# Patient Record
Sex: Female | Born: 1960 | Race: White | Hispanic: No | Marital: Single | Smoking: Former smoker
Health system: Southern US, Community
[De-identification: ages and names within clinical notes are randomized; demographics above are authoritative.]

## PROBLEM LIST (undated history)

## (undated) DIAGNOSIS — E039 Hypothyroidism, unspecified: Secondary | ICD-10-CM

## (undated) DIAGNOSIS — G43909 Migraine, unspecified, not intractable, without status migrainosus: Secondary | ICD-10-CM

## (undated) HISTORY — DX: Hypothyroidism, unspecified: E03.9

## (undated) HISTORY — PX: OTHER SURGICAL HISTORY: SHX169

## (undated) HISTORY — DX: Migraine, unspecified, not intractable, without status migrainosus: G43.909

---

## 2014-04-27 DIAGNOSIS — S63642A Sprain of metacarpophalangeal joint of left thumb, initial encounter: Secondary | ICD-10-CM | POA: Insufficient documentation

## 2014-07-17 DIAGNOSIS — S63502A Unspecified sprain of left wrist, initial encounter: Secondary | ICD-10-CM | POA: Insufficient documentation

## 2015-12-27 DIAGNOSIS — E039 Hypothyroidism, unspecified: Secondary | ICD-10-CM | POA: Insufficient documentation

## 2016-01-03 ENCOUNTER — Other Ambulatory Visit: Payer: Self-pay | Admitting: Obstetrics and Gynecology

## 2016-01-03 DIAGNOSIS — Z1231 Encounter for screening mammogram for malignant neoplasm of breast: Secondary | ICD-10-CM

## 2016-01-07 ENCOUNTER — Ambulatory Visit
Admission: RE | Admit: 2016-01-07 | Discharge: 2016-01-07 | Disposition: A | Discharge status: Home / Self Care | Payer: Managed Care, Other (non HMO) | Source: Ambulatory Visit | Attending: Obstetrics and Gynecology | Admitting: Obstetrics and Gynecology

## 2016-01-07 DIAGNOSIS — Z1231 Encounter for screening mammogram for malignant neoplasm of breast: Secondary | ICD-10-CM

## 2016-12-27 ENCOUNTER — Other Ambulatory Visit: Payer: Self-pay | Admitting: Obstetrics and Gynecology

## 2016-12-27 ENCOUNTER — Other Ambulatory Visit: Payer: Self-pay | Admitting: Nurse Practitioner

## 2016-12-27 DIAGNOSIS — Z1231 Encounter for screening mammogram for malignant neoplasm of breast: Secondary | ICD-10-CM

## 2017-01-08 ENCOUNTER — Ambulatory Visit
Admission: RE | Admit: 2017-01-08 | Discharge: 2017-01-08 | Disposition: A | Discharge status: Home / Self Care | Payer: 59 | Source: Ambulatory Visit | Attending: Nurse Practitioner | Admitting: Nurse Practitioner

## 2017-01-08 DIAGNOSIS — Z1231 Encounter for screening mammogram for malignant neoplasm of breast: Secondary | ICD-10-CM

## 2017-07-31 ENCOUNTER — Telehealth: Payer: Self-pay | Admitting: *Deleted

## 2017-07-31 MED ORDER — MEDROXYPROGESTERONE ACETATE 2.5 MG PO TABS: 2.5000 mg | ORAL_TABLET | Freq: Every day | ORAL | 0 refills | Status: DC

## 2017-07-31 NOTE — Telephone Encounter (Signed)
Pt called requesting refill on provera 2.5 mg tablet 1 po daily, pt annual scheduled on 08/14/17

## 2017-08-14 ENCOUNTER — Encounter: Payer: Self-pay | Admitting: Obstetrics & Gynecology

## 2017-09-22 ENCOUNTER — Other Ambulatory Visit: Payer: Self-pay | Admitting: Obstetrics & Gynecology

## 2017-09-24 NOTE — Telephone Encounter (Signed)
Former Chief Technology Officer OB-Gyn patient. Last Annual exam was 02/11/2016.  Was originally scheduled here with you on 08/14/17 but cancelled that appt. Rescheduled now for 11/22/17.  Requesting refills for Provera 2.5 mg.

## 2017-10-01 ENCOUNTER — Telehealth: Payer: Self-pay | Admitting: *Deleted

## 2017-10-01 MED ORDER — ESTRADIOL 0.05 MG/24HR TD PTWK: 0.0500 mg | MEDICATED_PATCH | TRANSDERMAL | 0 refills | Status: DC

## 2017-10-01 NOTE — Telephone Encounter (Signed)
Paper chart from [facility] has arrived needs refill on estradiol 0.5 mg patch weekly sent, annual scheduled 11/22/17. Rx sent.

## 2017-11-22 ENCOUNTER — Ambulatory Visit: Payer: BLUE CROSS/BLUE SHIELD | Admitting: Obstetrics & Gynecology

## 2017-11-22 ENCOUNTER — Encounter: Payer: Self-pay | Admitting: Obstetrics & Gynecology

## 2017-11-22 VITALS — BP 124/78 | Ht 64.5 in | Wt 174.4 lb

## 2017-11-22 DIAGNOSIS — R6882 Decreased libido: Secondary | ICD-10-CM

## 2017-11-22 DIAGNOSIS — Z01419 Encounter for gynecological examination (general) (routine) without abnormal findings: Secondary | ICD-10-CM

## 2017-11-22 DIAGNOSIS — Z13820 Encounter for screening for osteoporosis: Secondary | ICD-10-CM

## 2017-11-22 DIAGNOSIS — N951 Menopausal and female climacteric states: Secondary | ICD-10-CM

## 2017-11-22 MED ORDER — ESTRADIOL 0.075 MG/24HR TD PTWK: 0.0500 mg | MEDICATED_PATCH | TRANSDERMAL | 4 refills | Status: DC

## 2017-11-22 MED ORDER — MEDROXYPROGESTERONE ACETATE 2.5 MG PO TABS: 2.5000 mg | ORAL_TABLET | Freq: Every day | ORAL | 4 refills | Status: DC

## 2017-11-22 NOTE — Patient Instructions (Addendum)
1. Encounter for routine gynecological examination with Papanicolaou smear of cervix Normal gynecologic exam.  Pap reflex done.  Breast exam normal.  Last screening mammogram in August 2018 was negative.  Colonoscopy in 2014.  Health labs with family physician.  Body mass index 29.47.  Recommend low calorie/low carb nutrition such as [employer] diet with regular physical activity, aerobic activities 5 times a week and weightlifting every 2 days.  2. Menopause syndrome Symptomatic menopause with vasomotor symptoms which were controlled with estradiol 0.075 mg patch weekly and Provera 2.5 mg 1 tablet daily.  Will prescribe hormone replacement therapy at that dosage.  Risks and benefits of hormone replacement therapy known to patient.  Wishes to continue at this time.  No postmenopausal bleeding.  3. Low libido Multifactorial aspect of low libido reviewed with patient.  After working on relationship and psychologic aspects of it, if still experiencing low libido, will follow up for counseling and management with me.  4. Screening for osteoporosis Vitamin D supplement, calcium rich nutrition and regular weightbearing physical activity recommended.  She will schedule a repeat bone density at the breast center. - DG Bone Density; Future  Other orders - thyroid (ARMOUR) 90 MG tablet; Take 90 mg by mouth daily. - cholecalciferol (VITAMIN D) 1000 units tablet; Take 1,000 Units by mouth daily. - medroxyPROGESTERone (PROVERA) 2.5 MG tablet; Take 1 tablet (2.5 mg total) by mouth daily. - estradiol (CLIMARA - DOSED IN MG/24 HR) 0.075 mg/24hr patch; Place 1 patch (0.075 mg total) onto the skin once a week.  Cala BradfordKimberly, it was a pleasure seeing you today!  I will inform you of your results as soon as they are available.

## 2017-11-22 NOTE — Progress Notes (Signed)
Tina Pace 01/16/1961 [id number]   History:    57 y.o. G0 Same sex partner x 3 years, separated currently  RP:  Established patient presenting for annual gyn exam   HPI: Menopause syndrome well on HRT.  Having hot flushes/night sweats on Estradiol patch 0.05 weekly, would like to be back on 0.075.  Provera 2.5 mg daily.  No PMB.  No pelvic pain.  Low libido.  Urine/BMs wnl.  Breasts wnl.  BMI 29.47.  Fasting health labs with Fam MD.    Past medical history,surgical history, family history and social history were all reviewed and documented in the EPIC chart.  Gynecologic History No LMP recorded. Patient is postmenopausal. Contraception: post menopausal status/Same sex partner Last Pap: 01/2016. Results were: Negative/HPV HR neg Last mammogram: 12/2016. Results were: Negative Bone Density: 2010.  Will schedule at the Breast center Colonoscopy: 2014  Obstetric History OB History  Gravida Para Term Preterm AB Living  0 0 0 0 0 0  SAB TAB Ectopic Multiple Live Births  0 0 0 0 0     ROS: A ROS was performed and pertinent positives and negatives are included in the history.  GENERAL: No fevers or chills. HEENT: No change in vision, no earache, sore throat or sinus congestion. NECK: No pain or stiffness. CARDIOVASCULAR: No chest pain or pressure. No palpitations. PULMONARY: No shortness of breath, cough or wheeze. GASTROINTESTINAL: No abdominal pain, nausea, vomiting or diarrhea, melena or bright red blood per rectum. GENITOURINARY: No urinary frequency, urgency, hesitancy or dysuria. MUSCULOSKELETAL: No joint or muscle pain, no back pain, no recent trauma. DERMATOLOGIC: No rash, no itching, no lesions. ENDOCRINE: No polyuria, polydipsia, no heat or cold intolerance. No recent change in weight. HEMATOLOGICAL: No anemia or easy bruising or bleeding. NEUROLOGIC: No headache, seizures, numbness, tingling or weakness. PSYCHIATRIC: No depression, no loss of interest in normal activity or change  in sleep pattern.     Exam:   BP 124/78   Ht 5' 4.5" (1.638 m)   Wt 174 lb 6.4 oz (79.1 kg)   BMI 29.47 kg/m   Body mass index is 29.47 kg/m.  General appearance : Well developed well nourished female. No acute distress HEENT: Eyes: no retinal hemorrhage or exudates,  Neck supple, trachea midline, no carotid bruits, no thyroidmegaly Lungs: Clear to auscultation, no rhonchi or wheezes, or rib retractions  Heart: Regular rate and rhythm, no murmurs or gallops Breast:Examined in sitting and supine position were symmetrical in appearance, no palpable masses or tenderness,  no skin retraction, no nipple inversion, no nipple discharge, no skin discoloration, no axillary or supraclavicular lymphadenopathy Abdomen: no palpable masses or tenderness, no rebound or guarding Extremities: no edema or skin discoloration or tenderness  Pelvic: Vulva: Normal             Vagina: No gross lesions or discharge  Cervix: No gross lesions or discharge.  Pap reflex done  Uterus  AV, normal size, shape and consistency, non-tender and mobile  Adnexa  Without masses or tenderness  Anus: Normal   Assessment/Plan:  57 y.o. female for annual exam   1. Encounter for routine gynecological examination with Papanicolaou smear of cervix Normal gynecologic exam.  Pap reflex done.  Breast exam normal.  Last screening mammogram in August 2018 was negative.  Colonoscopy in 2014.  Health labs with family physician.  Body mass index 29.47.  Recommend low calorie/low carb nutrition such as [employer] diet with regular physical activity, aerobic activities 5 times  a week and weightlifting every 2 days.  2. Menopause syndrome Symptomatic menopause with vasomotor symptoms which were controlled with estradiol 0.075 mg patch weekly and Provera 2.5 mg 1 tablet daily.  Will prescribe hormone replacement therapy at that dosage.  Risks and benefits of hormone replacement therapy known to patient.  Wishes to continue at this  time.  No postmenopausal bleeding.  3. Low libido Multifactorial aspect of low libido reviewed with patient.  After working on relationship and psychologic aspects of it, if still experiencing low libido, will follow up for counseling and management with me.  4. Screening for osteoporosis Vitamin D supplement, calcium rich nutrition and regular weightbearing physical activity recommended.  She will schedule a repeat bone density at the breast center. - DG Bone Density; Future  Other orders - thyroid (ARMOUR) 90 MG tablet; Take 90 mg by mouth daily. - cholecalciferol (VITAMIN D) 1000 units tablet; Take 1,000 Units by mouth daily. - medroxyPROGESTERone (PROVERA) 2.5 MG tablet; Take 1 tablet (2.5 mg total) by mouth daily. - estradiol (CLIMARA - DOSED IN MG/24 HR) 0.075 mg/24hr patch; Place 1 patch (0.075 mg total) onto the skin once a week.  Counseling on above issues and coordination of care more than 50% for 10 minutes.  [name] MD, 2:49 PM 11/22/2017

## 2017-11-26 LAB — PAP IG W/ RFLX HPV ASCU

## 2017-11-26 LAB — HUMAN PAPILLOMAVIRUS, HIGH RISK: HPV DNA High Risk: NOT DETECTED

## 2017-12-19 ENCOUNTER — Other Ambulatory Visit: Payer: Self-pay | Admitting: Obstetrics & Gynecology

## 2017-12-19 DIAGNOSIS — E2839 Other primary ovarian failure: Secondary | ICD-10-CM

## 2017-12-24 ENCOUNTER — Other Ambulatory Visit: Payer: Self-pay | Admitting: Obstetrics & Gynecology

## 2018-04-09 ENCOUNTER — Other Ambulatory Visit: Payer: Self-pay | Admitting: Obstetrics & Gynecology

## 2018-04-09 ENCOUNTER — Telehealth: Payer: Self-pay

## 2018-04-09 DIAGNOSIS — N950 Postmenopausal bleeding: Secondary | ICD-10-CM

## 2018-04-09 NOTE — Telephone Encounter (Signed)
Stop HRT and schedule a Pelvic US with me.

## 2018-04-09 NOTE — Telephone Encounter (Signed)
Patient complained of bleeding that started on the weekend while out of town. She said she forgot her Provera 2.5 and missed a few doses. Also, had the lower dose patch with her instead of the 0.075 she is on.  She said that now that she is back on her provera that each time she takes it the flow lightens but like a regular flow during the day.  What to rec?

## 2018-04-09 NOTE — Telephone Encounter (Signed)
No change, same recommendations.

## 2018-04-09 NOTE — Telephone Encounter (Signed)
Called and [facility] detailed message [facility] mail [facility]. Advised patient Dr. Simon RheinML's recommendation. I told her order has been placed and she just needs to call back and speak with appointment desk to schedule an ultrasound appt with Dr. Mackey BirchwoodML.

## 2018-04-09 NOTE — Telephone Encounter (Signed)
Patient called back and said she realized she had actually missed two weeks of her Provera. She questions if this could be why she is bleeding and does it change your recommendation?

## 2018-04-09 NOTE — Telephone Encounter (Signed)
Patient advised. Tina Pace will call her back to schedule.

## 2018-04-10 NOTE — Telephone Encounter (Signed)
Spoke with patient and informed her. °

## 2018-04-10 NOTE — Telephone Encounter (Signed)
Patient called back today. First available u/s is Dec 18th and she is scheduled. She is asking if it might be okay if she stayed on the HRT a bit longer. She said she cannot think clearly without it and has bad hotflashes at night. Anniversary of her Mom's death is day after Thanksgiving and she just feels like she needs her HRT.  She said even if just a few weeks and stop it a little closer to u/s.

## 2018-04-10 NOTE — Telephone Encounter (Signed)
Please stop at least 2 weeks prior to US.

## 2018-05-01 ENCOUNTER — Other Ambulatory Visit: Payer: Self-pay | Admitting: *Deleted

## 2018-05-01 ENCOUNTER — Ambulatory Visit (INDEPENDENT_AMBULATORY_CARE_PROVIDER_SITE_OTHER): Payer: BLUE CROSS/BLUE SHIELD

## 2018-05-01 ENCOUNTER — Other Ambulatory Visit: Payer: Self-pay | Admitting: Obstetrics & Gynecology

## 2018-05-01 ENCOUNTER — Ambulatory Visit: Payer: BLUE CROSS/BLUE SHIELD | Admitting: Obstetrics & Gynecology

## 2018-05-01 DIAGNOSIS — N838 Other noninflammatory disorders of ovary, fallopian tube and broad ligament: Secondary | ICD-10-CM

## 2018-05-01 DIAGNOSIS — N950 Postmenopausal bleeding: Secondary | ICD-10-CM | POA: Diagnosis not present

## 2018-05-01 DIAGNOSIS — N951 Menopausal and female climacteric states: Secondary | ICD-10-CM | POA: Diagnosis not present

## 2018-05-01 DIAGNOSIS — Z79890 Hormone replacement therapy (postmenopausal): Secondary | ICD-10-CM

## 2018-05-01 MED ORDER — PROGESTERONE MICRONIZED 100 MG PO CAPS: 100.0000 mg | ORAL_CAPSULE | Freq: Every day | ORAL | 4 refills | Status: DC

## 2018-05-01 NOTE — Progress Notes (Signed)
    Tina BowlKimberly Pace 06/22/1960 [id number]        57 y.o.  G0 Single  RP: PMB on HRT  HPI: Used a lower Estradiol patch by mistake and started spotting 2 weeks ago.  Stopped HRT at that time and has had no vaginal bleeding since then.  No pelvic pain.   OB History  Gravida Para Term Preterm AB Living  0 0 0 0 0 0  SAB TAB Ectopic Multiple Live Births  0 0 0 0 0    Past medical history,surgical history, problem list, medications, allergies, family history and social history were all reviewed and documented in the EPIC chart.   Directed ROS with pertinent positives and negatives documented in the history of present illness/assessment and plan.  Exam:  There were no vitals filed for this visit. General appearance:  Normal  Pelvic US today: T/V images.  Anteverted uterus measuring 7.31 x 4.0 x 3.69 cm.  Intramural fibroid 1.2 x 1 cm, subserous fibroid 2.5 x 1.8 cm.  Endometrial lining normal and thin at 2.1 mm.  Right ovarian solid mass with calcifications and posterior wall shadowing measuring 1.4 x 1.4 x 1.6 cm.  Arterial and venous blood flow is present to the right ovary.  Left ovary normal.  No free fluid in the posterior to the sac.   Assessment/Plan:  57 y.o. G0  1. Postmenopausal bleeding PMB probably related to the change in estradiol dosage and the patch for a couple of days.  Pelvic ultrasound findings reviewed with patient.  Reassured that the endometrial lining was normal and thin at 2.1 mm.  No further investigation needed therefore.  Patient will restart on hormone replacement therapy with estradiol patch at 0.075 and Prometrium 100 mg at bedtime.  2. Menopausal syndrome on hormone replacement therapy Well on hormone replacement therapy.  No contraindication to HRT.  Will continue on estradiol patch 0.075.  Will change the progestin to micronized progesterone 100 mg capsule at bedtime.  Prescription sent to pharmacy.  3. Mass of right ovary Small solid right ovarian  nodule measuring 1.6 cm.  Compatible with an ovarian fibroid.  Patient reassured.  We will repeat a pelvic ultrasound in 6 months to confirm stability. - US Transvaginal Non-OB; Future  Other orders - progesterone (PROMETRIUM) 100 MG capsule; Take 1 capsule (100 mg total) by mouth at bedtime.  Counseling on above issues and coordination of care more than 50% for 25 minutes.  Genia DelMarie-Lyne Donnavin Vandenbrink MD, 2:45 PM 05/01/2018

## 2018-05-02 ENCOUNTER — Encounter: Payer: Self-pay | Admitting: Obstetrics & Gynecology

## 2018-05-02 NOTE — Patient Instructions (Signed)
1. Postmenopausal bleeding PMB probably related to the change in estradiol dosage and the patch for a couple of days.  Pelvic ultrasound findings reviewed with patient.  Reassured that the endometrial lining was normal and thin at 2.1 mm.  No further investigation needed therefore.  Patient will restart on hormone replacement therapy with estradiol patch at 0.075 and Prometrium 100 mg at bedtime.  2. Menopausal syndrome on hormone replacement therapy Well on hormone replacement therapy.  No contraindication to HRT.  Will continue on estradiol patch 0.075.  Will change the progestin to micronized progesterone 100 mg capsule at bedtime.  Prescription sent to pharmacy.  3. Mass of right ovary Small solid right ovarian nodule measuring 1.6 cm.  Compatible with an ovarian fibroid.  Patient reassured.  We will repeat a pelvic ultrasound in 6 months to confirm stability. - US Transvaginal Non-OB; Future  Other orders - progesterone (PROMETRIUM) 100 MG capsule; Take 1 capsule (100 mg total) by mouth at bedtime.  Cala BradfordKimberly, it was a pleasure seeing you today!

## 2018-10-30 ENCOUNTER — Other Ambulatory Visit: Payer: Self-pay

## 2018-10-30 ENCOUNTER — Ambulatory Visit: Payer: Self-pay | Admitting: Obstetrics & Gynecology

## 2019-01-06 ENCOUNTER — Encounter: Payer: Self-pay | Admitting: *Deleted

## 2019-01-06 ENCOUNTER — Other Ambulatory Visit: Payer: Self-pay | Admitting: *Deleted

## 2019-01-09 ENCOUNTER — Ambulatory Visit (INDEPENDENT_AMBULATORY_CARE_PROVIDER_SITE_OTHER): Payer: Self-pay | Admitting: Family Medicine

## 2019-01-09 ENCOUNTER — Encounter: Payer: Self-pay | Admitting: Family Medicine

## 2019-01-09 VITALS — BP 125/96 | HR 79 | Temp 98.4°F | Ht 64.5 in | Wt 137.0 lb

## 2019-01-09 DIAGNOSIS — Z1231 Encounter for screening mammogram for malignant neoplasm of breast: Secondary | ICD-10-CM

## 2019-01-09 DIAGNOSIS — Z131 Encounter for screening for diabetes mellitus: Secondary | ICD-10-CM

## 2019-01-09 DIAGNOSIS — G43009 Migraine without aura, not intractable, without status migrainosus: Secondary | ICD-10-CM

## 2019-01-09 DIAGNOSIS — Z23 Encounter for immunization: Secondary | ICD-10-CM

## 2019-01-09 DIAGNOSIS — Z13220 Encounter for screening for lipoid disorders: Secondary | ICD-10-CM

## 2019-01-09 DIAGNOSIS — E039 Hypothyroidism, unspecified: Secondary | ICD-10-CM

## 2019-01-09 NOTE — Progress Notes (Signed)
New Patient Office Visit  Subjective:  Patient ID: Tina Pace, female    DOB: 02/08/1961  Age: 58 y.o. MRN: [id number]  CC:  Chief Complaint  Patient presents with  . Establish Care    HPI Tina Pace presents to establish care.  She teaches anatomy and physiology.  She has had hypothyroidism since her 6320s. Her dose has varied over the year  She has done he best on armour thyroid.    She has hx of migraines without aura. Uses Zomig PRN.    Occ sees pain mgt for her shoulder for an injection. She is not on chronic pain medications.    History reviewed. No pertinent past medical history.  History reviewed. No pertinent surgical history.  Family History  Problem Relation Age of Onset  . Diabetes Mother   . Cancer Father        bladder/prostate    Social History   Socioeconomic History  . Marital status: Single    Spouse name: Not on file  . Number of children: Not on file  . Years of education: Not on file  . Highest education level: Not on file  Occupational History  . Occupation: Runner, broadcasting/film/videoteacher  Social Needs  . Financial resource strain: Not on file  . Food insecurity    Worry: Not on file    Inability: Not on file  . Transportation needs    Medical: Not on file    Non-medical: Not on file  Tobacco Use  . Smoking status: Former Smoker    Quit date: 11/22/1997    Years since quitting: 21.1  . Smokeless tobacco: Never Used  Substance and Sexual Activity  . Alcohol use: Yes    Comment: OCC  . Drug use: Not on file  . Sexual activity: Not Currently    Comment: 1st intercourse- 19, partners- 12,   Lifestyle  . Physical activity    Days per week: Not on file    Minutes per session: Not on file  . Stress: Not on file  Relationships  . Social Musicianconnections    Talks on phone: Not on file    Gets together: Not on file    Attends religious service: Not on file    Active member of club or organization: Not on file    Attends meetings of clubs or organizations:  Not on file    Relationship status: Not on file  . Intimate partner violence    Fear of current or ex partner: Not on file    Emotionally abused: Not on file    Physically abused: Not on file    Forced sexual activity: Not on file  Other Topics Concern  . Not on file  Social History Narrative  . Not on file    ROS Review of Systems  Objective:   Today's Vitals: BP (!) 125/96   Pulse 79   Temp 98.4 F (36.9 C)   Ht 5' 4.5" (1.638 m)   Wt 137 lb (62.1 kg)   SpO2 100%   BMI 23.15 kg/m   Physical Exam Constitutional:      Appearance: She is well-developed.  HENT:     Head: Normocephalic and atraumatic.  Cardiovascular:     Rate and Rhythm: Normal rate and regular rhythm.     Heart sounds: Normal heart sounds.  Pulmonary:     Effort: Pulmonary effort is normal.     Breath sounds: Normal breath sounds.  Skin:    General: Skin is warm  and dry.  Neurological:     Mental Status: She is alert and oriented to person, place, and time.  Psychiatric:        Behavior: Behavior normal.     Assessment & Plan:   Problem List Items Addressed This Visit      Cardiovascular and Mediastinum   Migraine without aura and without status migrainosus, not intractable    Not on prophylaxis.  Uses Zomig PRN.       Relevant Orders   COMPLETE METABOLIC PANEL WITH GFR   Lipid panel   Hemoglobin A1c   VITAMIN D 25 Hydroxy (Vit-D Deficiency, Fractures)   CBC   TSH     Endocrine   Acquired hypothyroidism - Primary    Due to recheck labs.  Will follow Q 6 mo      Relevant Orders   COMPLETE METABOLIC PANEL WITH GFR   Lipid panel   Hemoglobin A1c   VITAMIN D 25 Hydroxy (Vit-D Deficiency, Fractures)   CBC   TSH    Other Visit Diagnoses    Screening, lipid       Relevant Orders   Lipid panel   Screening for diabetes mellitus       Relevant Orders   Hemoglobin A1c   Need for immunization against influenza       Relevant Orders   Flu Vaccine QUAD 36+ mos IM (Completed)    Screening mammogram, encounter for       Relevant Orders   MM 3D SCREEN BREAST BILATERAL     Discussed Shingrix as well.     Outpatient Encounter Medications as of 01/09/2019  Medication Sig  . cholecalciferol (VITAMIN D) 1000 units tablet Take 1,000 Units by mouth daily.  . Omega-3 Fatty Acids (FISH OIL) 1000 MG CAPS Take by mouth.  . progesterone (PROMETRIUM) 100 MG capsule Take 1 capsule (100 mg total) by mouth at bedtime.  Marland Kitchen thyroid (ARMOUR) 90 MG tablet Take 90 mg by mouth daily.  Marland Kitchen zolmitriptan (ZOMIG) 5 MG tablet TAKE 1 TABLET DAILY AS NEEDED MAY REPEAT DOSE IN 1 HOUR IF NO EFFECT   No facility-administered encounter medications on file as of 01/09/2019.     Follow-up: Return in about 6 months (around 07/12/2019) for Thyroid .   Beatrice Lecher, MD

## 2019-01-09 NOTE — Patient Instructions (Signed)
Zoster Vaccine, Recombinant injection What is this medicine? ZOSTER VACCINE (ZOS ter vak SEEN) is used to prevent shingles in adults 56 years old and over. This vaccine is not used to treat shingles or nerve pain from shingles. This medicine may be used for other purposes; ask your health care provider or pharmacist if you have questions. COMMON BRAND NAME(S): SHINGRIX What should I tell my health care provider before I take this medicine? They need to know if you have any of these conditions:  blood disorders or disease  cancer like leukemia or lymphoma  immune system problems or therapy  an unusual or allergic reaction to vaccines, other medications, foods, dyes, or preservatives  pregnant or trying to get pregnant  breast-feeding How should I use this medicine? This vaccine is for injection in a muscle. It is given by a health care professional. Talk to your pediatrician regarding the use of this medicine in children. This medicine is not approved for use in children. Overdosage: If you think you have taken too much of this medicine contact a poison control center or emergency room at once. NOTE: This medicine is only for you. Do not share this medicine with others. What if I miss a dose? Keep appointments for follow-up (booster) doses as directed. It is important not to miss your dose. Call your doctor or health care professional if you are unable to keep an appointment. What may interact with this medicine?  medicines that suppress your immune system  medicines to treat cancer  steroid medicines like prednisone or cortisone This list may not describe all possible interactions. Give your health care provider a list of all the medicines, herbs, non-prescription drugs, or dietary supplements you use. Also tell them if you smoke, drink alcohol, or use illegal drugs. Some items may interact with your medicine. What should I watch for while using this medicine? Visit your doctor for  regular check ups. This vaccine, like all vaccines, may not fully protect everyone. What side effects may I notice from receiving this medicine? Side effects that you should report to your doctor or health care professional as soon as possible:  allergic reactions like skin rash, itching or hives, swelling of the face, lips, or tongue  breathing problems Side effects that usually do not require medical attention (report these to your doctor or health care professional if they continue or are bothersome):  chills  headache  fever  nausea, vomiting  redness, warmth, pain, swelling or itching at site where injected  tiredness This list may not describe all possible side effects. Call your doctor for medical advice about side effects. You may report side effects to FDA at 1-800-FDA-1088. Where should I keep my medicine? This vaccine is only given in a clinic, pharmacy, doctor's office, or other health care setting and will not be stored at home. NOTE: This sheet is a summary. It may not cover all possible information. If you have questions about this medicine, talk to your doctor, pharmacist, or health care provider.  2020 Elsevier/Gold Standard (2016-12-11 13:20:30)  

## 2019-01-09 NOTE — Progress Notes (Signed)
[phone]  

## 2019-01-10 ENCOUNTER — Encounter: Payer: Self-pay | Admitting: Family Medicine

## 2019-01-10 DIAGNOSIS — G43009 Migraine without aura, not intractable, without status migrainosus: Secondary | ICD-10-CM | POA: Insufficient documentation

## 2019-01-10 NOTE — Assessment & Plan Note (Signed)
Not on prophylaxis.  Uses Zomig PRN.

## 2019-01-10 NOTE — Assessment & Plan Note (Signed)
Due to recheck labs.  Will follow Q 6 mo

## 2019-01-23 ENCOUNTER — Other Ambulatory Visit: Payer: Self-pay | Admitting: Obstetrics & Gynecology

## 2019-01-25 DIAGNOSIS — Z20828 Contact with and (suspected) exposure to other viral communicable diseases: Secondary | ICD-10-CM | POA: Diagnosis not present

## 2019-01-29 ENCOUNTER — Telehealth: Payer: Self-pay

## 2019-01-29 DIAGNOSIS — Z13220 Encounter for screening for lipoid disorders: Secondary | ICD-10-CM | POA: Diagnosis not present

## 2019-01-29 DIAGNOSIS — Z131 Encounter for screening for diabetes mellitus: Secondary | ICD-10-CM | POA: Diagnosis not present

## 2019-01-29 DIAGNOSIS — E039 Hypothyroidism, unspecified: Secondary | ICD-10-CM | POA: Diagnosis not present

## 2019-01-29 DIAGNOSIS — E559 Vitamin D deficiency, unspecified: Secondary | ICD-10-CM | POA: Diagnosis not present

## 2019-01-29 NOTE — Telephone Encounter (Signed)
Patient called- she is wanting to know if T3 and T4 could be added to her labs she had drawn this morning.. I do no see any previous labs with abn TSH but does have DX of hypothyroidism

## 2019-01-29 NOTE — Telephone Encounter (Signed)
Ok to add Free T4 and free T3

## 2019-01-29 NOTE — Telephone Encounter (Signed)
Spoke to [facility] at [place] and added on tests

## 2019-01-30 ENCOUNTER — Encounter: Payer: Self-pay | Admitting: Family Medicine

## 2019-01-30 ENCOUNTER — Ambulatory Visit: Payer: Self-pay

## 2019-01-30 DIAGNOSIS — Z1231 Encounter for screening mammogram for malignant neoplasm of breast: Secondary | ICD-10-CM | POA: Diagnosis not present

## 2019-01-30 DIAGNOSIS — R7301 Impaired fasting glucose: Secondary | ICD-10-CM | POA: Insufficient documentation

## 2019-01-30 LAB — HM MAMMOGRAPHY

## 2019-01-31 ENCOUNTER — Encounter: Payer: Self-pay | Admitting: Family Medicine

## 2019-01-31 LAB — HEMOGLOBIN A1C
Hgb A1c MFr Bld: 6 % of total Hgb — ABNORMAL HIGH (ref <5.7)
Mean Plasma Glucose: 126 (calc)
eAG (mmol/L): 7 (calc)

## 2019-01-31 LAB — T4, FREE: Free T4: 0.8 ng/dL (ref 0.8–1.8)

## 2019-01-31 LAB — TEST AUTHORIZATION

## 2019-01-31 LAB — LIPID PANEL
Cholesterol: 185 mg/dL (ref <200)
HDL: 77 mg/dL (ref >=50)
LDL Cholesterol (Calc): 94 mg/dL (calc)
Non-HDL Cholesterol (Calc): 108 mg/dL (calc) (ref <130)
Total CHOL/HDL Ratio: 2.4 (calc) (ref <5.0)
Triglycerides: 58 mg/dL (ref <150)

## 2019-01-31 LAB — CBC
HCT: 41.8 % (ref 35.0–45.0)
Hemoglobin: 13.8 g/dL (ref 11.7–15.5)
MCH: 29.8 pg (ref 27.0–33.0)
MCHC: 33 g/dL (ref 32.0–36.0)
MCV: 90.3 fL (ref 80.0–100.0)
MPV: 9.8 fL (ref 7.5–12.5)
Platelets: 297 10*3/uL (ref 140–400)
RBC: 4.63 10*6/uL (ref 3.80–5.10)
RDW: 13.7 % (ref 11.0–15.0)
WBC: 7.2 10*3/uL (ref 3.8–10.8)

## 2019-01-31 LAB — COMPLETE METABOLIC PANEL WITH GFR
AG Ratio: 1.6 (calc) (ref 1.0–2.5)
ALT: 16 U/L (ref 6–29)
AST: 21 U/L (ref 10–35)
Albumin: 4.5 g/dL (ref 3.6–5.1)
Alkaline phosphatase (APISO): 69 U/L (ref 37–153)
BUN: 12 mg/dL (ref 7–25)
CO2: 29 mmol/L (ref 20–32)
Calcium: 9.8 mg/dL (ref 8.6–10.4)
Chloride: 103 mmol/L (ref 98–110)
Creat: 1.01 mg/dL (ref 0.50–1.05)
GFR, Est African American: 72 mL/min/{1.73_m2} (ref >=60)
GFR, Est Non African American: 62 mL/min/{1.73_m2} (ref >=60)
Globulin: 2.8 g/dL (calc) (ref 1.9–3.7)
Glucose, Bld: 102 mg/dL — ABNORMAL HIGH (ref 65–99)
Potassium: 4.3 mmol/L (ref 3.5–5.3)
Sodium: 141 mmol/L (ref 135–146)
Total Bilirubin: 0.5 mg/dL (ref 0.2–1.2)
Total Protein: 7.3 g/dL (ref 6.1–8.1)

## 2019-01-31 LAB — TSH: TSH: 18.63 mIU/L — ABNORMAL HIGH (ref 0.40–4.50)

## 2019-01-31 LAB — VITAMIN D 25 HYDROXY (VIT D DEFICIENCY, FRACTURES): Vit D, 25-Hydroxy: 40 ng/mL (ref 30–100)

## 2019-01-31 LAB — T3, FREE: T3, Free: 2.6 pg/mL (ref 2.3–4.2)

## 2019-02-03 ENCOUNTER — Encounter: Payer: Self-pay | Admitting: Family Medicine

## 2019-02-03 MED ORDER — NP THYROID 90 MG PO TABS: 90.0000 mg | ORAL_TABLET | Freq: Every day | ORAL | 0 refills | Status: DC

## 2019-02-05 DIAGNOSIS — R921 Mammographic calcification found on diagnostic imaging of breast: Secondary | ICD-10-CM | POA: Diagnosis not present

## 2019-02-05 DIAGNOSIS — R920 Mammographic microcalcification found on diagnostic imaging of breast: Secondary | ICD-10-CM | POA: Diagnosis not present

## 2019-02-05 LAB — HM MAMMOGRAPHY

## 2019-02-10 ENCOUNTER — Encounter: Payer: Self-pay | Admitting: Family Medicine

## 2019-02-10 ENCOUNTER — Telehealth: Payer: Self-pay | Admitting: *Deleted

## 2019-02-10 DIAGNOSIS — R921 Mammographic calcification found on diagnostic imaging of breast: Secondary | ICD-10-CM | POA: Diagnosis not present

## 2019-02-10 DIAGNOSIS — N838 Other noninflammatory disorders of ovary, fallopian tube and broad ligament: Secondary | ICD-10-CM

## 2019-02-10 NOTE — Telephone Encounter (Signed)
Patient called requesting to have ultrasound and annual exam done same day. Patient was told to come back in 6 months from 05/01/18 due to right ovarian nodule this was never done at 6 months. Please advise

## 2019-02-11 NOTE — Telephone Encounter (Signed)
Agree with Annual Gyn visit and Pelvic US at same time.  No gel for [place] if before the Gyn exam.

## 2019-02-12 NOTE — Telephone Encounter (Signed)
Patient informed with below, I explained insurance may not cover both exam and ultrasound and waiver will be signed at visit. Patient said she would prefer to have annual exam done first then will discuss with [facility] scheduling ultrasound at later date.

## 2019-03-23 ENCOUNTER — Encounter: Payer: Self-pay | Admitting: Family Medicine

## 2019-03-23 DIAGNOSIS — E039 Hypothyroidism, unspecified: Secondary | ICD-10-CM

## 2019-03-26 ENCOUNTER — Other Ambulatory Visit: Payer: Self-pay | Admitting: Family Medicine

## 2019-03-26 DIAGNOSIS — E039 Hypothyroidism, unspecified: Secondary | ICD-10-CM

## 2019-03-26 MED ORDER — THYROID 15 MG PO TABS: 15.0000 mg | ORAL_TABLET | Freq: Every day | ORAL | 1 refills | Status: DC

## 2019-03-26 MED ORDER — THYROID 60 MG PO TABS: 60.0000 mg | ORAL_TABLET | Freq: Every day | ORAL | 1 refills | Status: DC

## 2019-03-26 NOTE — Telephone Encounter (Signed)
Both new rx sent. Recheck 6 wk

## 2019-03-26 NOTE — Progress Notes (Signed)
Labs ordered for 6 weeks out per PCP. I will let the patient know.

## 2019-03-27 ENCOUNTER — Encounter: Payer: Self-pay | Admitting: Family Medicine

## 2019-03-31 ENCOUNTER — Telehealth: Payer: Self-pay | Admitting: Family Medicine

## 2019-03-31 NOTE — Telephone Encounter (Signed)
Vitamin D is not being covered by insurance from lab draw on 01/09/19. Here is a list of diagnosis they will cover:  Covered codes -   E21.0 Primary hyperparathyroidism E21.1 Secondary hyperparathyroidism, not elsewhere classified E21.3 Hyperparathyroidism, unspecified E55.9 Vitamin D deficiency, unspecified E83.51 Hypocalcemia E83.52 Hypercalcemia E83.59 Other disorders of calcium metabolism M60.89 Other myositis, multiple sites M79.1 Myalgia M79.7 Fibromyalgia M81.0 Age-related osteoporosis without current pathological fracture M81.8 Other osteoporosis without current pathological fracture M85.80 Other specified disorders of bone density and structure, unspecified site M85.89 Other specified disorders of bone density and structure, multiple sites M85.9 Disorder of bone density and structure, unspecified M89.9 Disorder of bone, unspecified N18.3 Chronic kidney disease, stage 3 (moderate) N18.4 Chronic kidney disease, stage 4 (severe) N25.81 Secondary hyperparathyroidism of renal origin Z79.899 Other long term (current) drug therapy

## 2019-04-04 NOTE — Telephone Encounter (Signed)
Ok to submit  Z79.899 Other long term (current) drug therapy

## 2019-04-08 NOTE — Telephone Encounter (Signed)
Diagnosis code entered into [facility]. KG LPN 

## 2019-04-13 DIAGNOSIS — Z20828 Contact with and (suspected) exposure to other viral communicable diseases: Secondary | ICD-10-CM | POA: Diagnosis not present

## 2019-04-30 ENCOUNTER — Other Ambulatory Visit: Payer: Self-pay | Admitting: Family Medicine

## 2019-05-01 ENCOUNTER — Other Ambulatory Visit: Payer: Self-pay | Admitting: Obstetrics & Gynecology

## 2019-05-01 NOTE — Telephone Encounter (Signed)
Annual exam scheduled on 05/23/19

## 2019-05-22 ENCOUNTER — Other Ambulatory Visit: Payer: Self-pay

## 2019-05-23 ENCOUNTER — Encounter: Payer: Self-pay | Admitting: Obstetrics & Gynecology

## 2019-05-23 ENCOUNTER — Other Ambulatory Visit: Payer: Self-pay | Admitting: Family Medicine

## 2019-05-23 ENCOUNTER — Ambulatory Visit: Payer: BC Managed Care – PPO | Admitting: Obstetrics & Gynecology

## 2019-05-23 VITALS — BP 118/76 | Ht 65.0 in | Wt 140.0 lb

## 2019-05-23 DIAGNOSIS — E039 Hypothyroidism, unspecified: Secondary | ICD-10-CM

## 2019-05-23 DIAGNOSIS — Z01419 Encounter for gynecological examination (general) (routine) without abnormal findings: Secondary | ICD-10-CM | POA: Diagnosis not present

## 2019-05-23 DIAGNOSIS — N838 Other noninflammatory disorders of ovary, fallopian tube and broad ligament: Secondary | ICD-10-CM | POA: Diagnosis not present

## 2019-05-23 DIAGNOSIS — R87610 Atypical squamous cells of undetermined significance on cytologic smear of cervix (ASC-US): Secondary | ICD-10-CM

## 2019-05-23 DIAGNOSIS — Z780 Asymptomatic menopausal state: Secondary | ICD-10-CM

## 2019-05-23 NOTE — Progress Notes (Signed)
Karra Pink 07/16/1960 [id number]   History:    59 y.o. G0 Same sex partner  RP:  Established patient presenting for annual gyn exam   HPI: Postmenopause, well on no HRT x 3 months.  No PMB x last year.  No pelvic pain.  Pap 11/2017 ASCUS/HPV HR negative.  Urine/BMs wnl.  Breasts wnl.  Screening Mammo Probably Benign 01/2019.  BMI 23.30.  Good fitness/healthy nutrition. Colono 2014.  Fasting health labs with Fam MD.    Past medical history,surgical history, family history and social history were all reviewed and documented in the EPIC chart.  Gynecologic History No LMP recorded. Patient is postmenopausal.  Obstetric History OB History  Gravida Para Term Preterm AB Living  0 0 0 0 0 0  SAB TAB Ectopic Multiple Live Births  0 0 0 0 0     ROS: A ROS was performed and pertinent positives and negatives are included in the history.  GENERAL: No fevers or chills. HEENT: No change in vision, no earache, sore throat or sinus congestion. NECK: No pain or stiffness. CARDIOVASCULAR: No chest pain or pressure. No palpitations. PULMONARY: No shortness of breath, cough or wheeze. GASTROINTESTINAL: No abdominal pain, nausea, vomiting or diarrhea, melena or bright red blood per rectum. GENITOURINARY: No urinary frequency, urgency, hesitancy or dysuria. MUSCULOSKELETAL: No joint or muscle pain, no back pain, no recent trauma. DERMATOLOGIC: No rash, no itching, no lesions. ENDOCRINE: No polyuria, polydipsia, no heat or cold intolerance. No recent change in weight. HEMATOLOGICAL: No anemia or easy bruising or bleeding. NEUROLOGIC: No headache, seizures, numbness, tingling or weakness. PSYCHIATRIC: No depression, no loss of interest in normal activity or change in sleep pattern.     Exam:   BP 118/76   Ht 5\' 5"  (1.651 m)   Wt 140 lb (63.5 kg)   BMI 23.30 kg/m   Body mass index is 23.3 kg/m.  General appearance : Well developed well nourished female. No acute distress HEENT: Eyes: no retinal  hemorrhage or exudates,  Neck supple, trachea midline, no carotid bruits, no thyroidmegaly Lungs: Clear to auscultation, no rhonchi or wheezes, or rib retractions  Heart: Regular rate and rhythm, no murmurs or gallops Breast:Examined in sitting and supine position were symmetrical in appearance, no palpable masses or tenderness,  no skin retraction, no nipple inversion, no nipple discharge, no skin discoloration, no axillary or supraclavicular lymphadenopathy Abdomen: no palpable masses or tenderness, no rebound or guarding Extremities: no edema or skin discoloration or tenderness  Pelvic: Vulva: Normal             Vagina: No gross lesions or discharge  Cervix: No gross lesions or discharge.  Pap reflex done.  Uterus  AV, normal size, shape and consistency, non-tender and mobile  Adnexa  Without masses or tenderness  Anus: Normal   Assessment/Plan:  59 y.o. female for annual exam   1. Encounter for routine gynecological examination with Papanicolaou smear of cervix Normal gynecologic exam in menopause.  Pap reflex done.  Breast exam normal.  Will do a screening mammogram in April 2021.  Colonoscopy 2014, on a 10-year basis.  Health labs with family physician.  Improved body mass index at 23.3 this year.  Continue with fitness and healthy nutrition.  2. ASCUS of cervix with negative high risk HPV Pap reflex done today.  3. Postmenopause Well on no hormone replacement therapy x3 months.  No postmenopausal bleeding.  Vitamin D supplements, calcium intake of 1.2 g/day and regular weightbearing physical activity  is recommended.  4. Mass of right ovary Small solid nodule on the right ovary at last pelvic ultrasound December 2019.  Will repeat a pelvic ultrasound to confirm stability. - US Transvaginal Non-OB; Future  Princess Bruins MD, 8:58 AM 05/23/2019

## 2019-05-23 NOTE — Addendum Note (Signed)
Addended by: Dayna Barker on: 05/23/2019 09:43 AM   Modules accepted: Orders

## 2019-05-23 NOTE — Patient Instructions (Signed)
1. Encounter for routine gynecological examination with Papanicolaou smear of cervix Normal gynecologic exam in menopause.  Pap reflex done.  Breast exam normal.  Will do a screening mammogram in April 2021.  Colonoscopy 2014, on a 10-year basis.  Health labs with family physician.  Improved body mass index at 23.3 this year.  Continue with fitness and healthy nutrition.  2. ASCUS of cervix with negative high risk HPV Pap reflex done today.  3. Postmenopause Well on no hormone replacement therapy x3 months.  No postmenopausal bleeding.  Vitamin D supplements, calcium intake of 1.2 g/day and regular weightbearing physical activity is recommended.  4. Mass of right ovary Small solid nodule on the right ovary at last pelvic ultrasound December 2019.  Will repeat a pelvic ultrasound to confirm stability. - US Transvaginal Non-OB; Future  Tina Pace, it was a pleasure seeing you today!  I will inform you of your results as soon as they are available.

## 2019-05-24 DIAGNOSIS — Z20828 Contact with and (suspected) exposure to other viral communicable diseases: Secondary | ICD-10-CM | POA: Diagnosis not present

## 2019-05-24 DIAGNOSIS — Z20822 Contact with and (suspected) exposure to covid-19: Secondary | ICD-10-CM | POA: Diagnosis not present

## 2019-05-26 DIAGNOSIS — E039 Hypothyroidism, unspecified: Secondary | ICD-10-CM | POA: Diagnosis not present

## 2019-05-26 LAB — PAP IG W/ RFLX HPV ASCU

## 2019-05-27 ENCOUNTER — Encounter: Payer: Self-pay | Admitting: Family Medicine

## 2019-05-27 DIAGNOSIS — E039 Hypothyroidism, unspecified: Secondary | ICD-10-CM

## 2019-05-27 LAB — TSH: TSH: 9.36 m[IU]/L — ABNORMAL HIGH (ref 0.40–4.50)

## 2019-05-28 MED ORDER — THYROID 90 MG PO TABS: 90.0000 mg | ORAL_TABLET | Freq: Every day | ORAL | 1 refills | Status: DC

## 2019-06-18 DIAGNOSIS — D229 Melanocytic nevi, unspecified: Secondary | ICD-10-CM | POA: Diagnosis not present

## 2019-06-18 DIAGNOSIS — D485 Neoplasm of uncertain behavior of skin: Secondary | ICD-10-CM | POA: Diagnosis not present

## 2019-06-18 DIAGNOSIS — L821 Other seborrheic keratosis: Secondary | ICD-10-CM | POA: Diagnosis not present

## 2019-07-07 ENCOUNTER — Encounter: Payer: Self-pay | Admitting: Family Medicine

## 2019-07-08 ENCOUNTER — Other Ambulatory Visit: Payer: Self-pay

## 2019-07-08 DIAGNOSIS — E039 Hypothyroidism, unspecified: Secondary | ICD-10-CM

## 2019-07-08 NOTE — Progress Notes (Signed)
Verified with Haskell Riling, practice administrator, standing order to add free T3 to pt's labs.  Tiajuana Amass, CMA

## 2019-07-09 ENCOUNTER — Encounter: Payer: Self-pay | Admitting: Family Medicine

## 2019-07-09 DIAGNOSIS — E039 Hypothyroidism, unspecified: Secondary | ICD-10-CM | POA: Diagnosis not present

## 2019-07-09 NOTE — Telephone Encounter (Signed)
Yes should be a TSH and free T4 not a T3.

## 2019-07-10 ENCOUNTER — Encounter: Payer: Self-pay | Admitting: Family Medicine

## 2019-07-10 LAB — TEST AUTHORIZATION

## 2019-07-10 LAB — T4, FREE: Free T4: 0.9 ng/dL (ref 0.8–1.8)

## 2019-07-10 LAB — T3, FREE: T3, Free: 3.9 pg/mL (ref 2.3–4.2)

## 2019-07-10 LAB — TSH: TSH: 2.04 mIU/L (ref 0.40–4.50)

## 2019-07-10 NOTE — Telephone Encounter (Signed)
I have contacted lab to add these on

## 2019-07-14 ENCOUNTER — Ambulatory Visit: Payer: Self-pay | Admitting: Family Medicine

## 2019-07-24 DIAGNOSIS — R921 Mammographic calcification found on diagnostic imaging of breast: Secondary | ICD-10-CM | POA: Diagnosis not present

## 2019-07-24 LAB — HM MAMMOGRAPHY

## 2019-07-28 ENCOUNTER — Ambulatory Visit (INDEPENDENT_AMBULATORY_CARE_PROVIDER_SITE_OTHER): Payer: BLUE CROSS/BLUE SHIELD | Admitting: Family Medicine

## 2019-07-28 ENCOUNTER — Encounter: Payer: Self-pay | Admitting: Family Medicine

## 2019-07-28 ENCOUNTER — Other Ambulatory Visit: Payer: Self-pay

## 2019-07-28 VITALS — BP 114/62 | HR 69 | Ht 65.0 in | Wt 137.0 lb

## 2019-07-28 DIAGNOSIS — E039 Hypothyroidism, unspecified: Secondary | ICD-10-CM

## 2019-07-28 DIAGNOSIS — R7301 Impaired fasting glucose: Secondary | ICD-10-CM

## 2019-07-28 DIAGNOSIS — J301 Allergic rhinitis due to pollen: Secondary | ICD-10-CM | POA: Diagnosis not present

## 2019-07-28 LAB — POCT GLYCOSYLATED HEMOGLOBIN (HGB A1C): Hemoglobin A1C: 5.7 % — AB (ref 4.0–5.6)

## 2019-07-28 NOTE — Patient Instructions (Addendum)
Allegra 180mg  24 hour.    Plan to recheck your thyroid in 3-4 months.

## 2019-07-28 NOTE — Progress Notes (Signed)
Established Patient Office Visit  Subjective:  Patient ID: Tina Pace, female    DOB: September 09, 1960  Age: 59 y.o. MRN: [id number]  CC:  Chief Complaint  Patient presents with  . Hypertension  . ifg    HPI Tina Pace presents for   Hypothyroidism - Taking medication regularly in the AM away from food and vitamins, etc. No recent change to skin, hair, or energy levels.   Have spring allergies. She is taking Zyrtec daily. Has used Claritin before.  REcently started Flonase.  Feels like zyrtec not working as well lately.  Says her allergies make her feel like her ears are little full and sometimes feels a little dizzy.  Impaired fasting glucose-no increased thirst or urination. No symptoms consistent with hypoglycemia.   History reviewed. No pertinent past medical history.  History reviewed. No pertinent surgical history.  Family History  Problem Relation Age of Onset  . Diabetes Mother   . Cancer Father        bladder/prostate    Social History   Socioeconomic History  . Marital status: Single    Spouse name: Not on file  . Number of children: Not on file  . Years of education: Not on file  . Highest education level: Not on file  Occupational History  . Occupation: Pharmacist, hospital  Tobacco Use  . Smoking status: Former Smoker    Quit date: 11/22/1997    Years since quitting: 21.6  . Smokeless tobacco: Never Used  Substance and Sexual Activity  . Alcohol use: Yes    Comment: OCC  . Drug use: Never  . Sexual activity: Yes    Comment: 1st intercourse- 19, More than 5 partners  Other Topics Concern  . Not on file  Social History Narrative  . Not on file   Social Determinants of Health   Financial Resource Strain:   . Difficulty of Paying Living Expenses:   Food Insecurity:   . Worried About Charity fundraiser in the Last Year:   . Arboriculturist in the Last Year:   Transportation Needs:   . Film/video editor (Medical):   Marland Kitchen Lack of Transportation  (Non-Medical):   Physical Activity:   . Days of Exercise per Week:   . Minutes of Exercise per Session:   Stress:   . Feeling of Stress :   Social Connections:   . Frequency of Communication with Friends and Family:   . Frequency of Social Gatherings with Friends and Family:   . Attends Religious Services:   . Active Member of Clubs or Organizations:   . Attends Archivist Meetings:   Marland Kitchen Marital Status:   Intimate Partner Violence:   . Fear of Current or Ex-Partner:   . Emotionally Abused:   Marland Kitchen Physically Abused:   . Sexually Abused:     Outpatient Medications Prior to Visit  Medication Sig Dispense Refill  . cholecalciferol (VITAMIN D) 1000 units tablet Take 1,000 Units by mouth daily.    . Omega-3 Fatty Acids (FISH OIL) 1000 MG CAPS Take by mouth.    . thyroid (ARMOUR THYROID) 90 MG tablet Take 1 tablet (90 mg total) by mouth daily. Brand only 30 tablet 1  . zolmitriptan (ZOMIG) 5 MG tablet TAKE 1 TABLET DAILY AS NEEDED MAY REPEAT DOSE IN 1 HOUR IF NO EFFECT     No facility-administered medications prior to visit.    No Known Allergies  ROS Review of Systems    Objective:  Physical Exam  Constitutional: She is oriented to person, place, and time. She appears well-developed and well-nourished.  HENT:  Head: Normocephalic and atraumatic.  Cardiovascular: Normal rate, regular rhythm and normal heart sounds.  Pulmonary/Chest: Effort normal and breath sounds normal.  Neurological: She is alert and oriented to person, place, and time.  Skin: Skin is warm and dry.  Psychiatric: She has a normal mood and affect. Her behavior is normal.    BP 114/62   Pulse 69   Ht 5\' 5"  (1.651 m)   Wt 137 lb (62.1 kg)   SpO2 99%   BMI 22.80 kg/m  Wt Readings from Last 3 Encounters:  07/28/19 137 lb (62.1 kg)  05/23/19 140 lb (63.5 kg)  01/09/19 137 lb (62.1 kg)     Health Maintenance Due  Topic Date Due  . Hepatitis C Screening  Never done  . HIV Screening  Never  done  . COLONOSCOPY  Never done    There are no preventive care reminders to display for this patient.  Lab Results  Component Value Date   TSH 2.04 07/09/2019   Lab Results  Component Value Date   WBC 7.2 01/29/2019   HGB 13.8 01/29/2019   HCT 41.8 01/29/2019   MCV 90.3 01/29/2019   PLT 297 01/29/2019   Lab Results  Component Value Date   NA 141 01/29/2019   K 4.3 01/29/2019   CO2 29 01/29/2019   GLUCOSE 102 (H) 01/29/2019   BUN 12 01/29/2019   CREATININE 1.01 01/29/2019   BILITOT 0.5 01/29/2019   AST 21 01/29/2019   ALT 16 01/29/2019   PROT 7.3 01/29/2019   CALCIUM 9.8 01/29/2019   Lab Results  Component Value Date   CHOL 185 01/29/2019   Lab Results  Component Value Date   HDL 77 01/29/2019   Lab Results  Component Value Date   LDLCALC 94 01/29/2019   Lab Results  Component Value Date   TRIG 58 01/29/2019   Lab Results  Component Value Date   CHOLHDL 2.4 01/29/2019   Lab Results  Component Value Date   HGBA1C 5.7 (A) 07/28/2019      Assessment & Plan:   Problem List Items Addressed This Visit      Endocrine   IFG (impaired fasting glucose) - Primary    Hemoglobin A1c looks great.  She brought it down to 5.7 from previous of 6.0 over the last 6 months.  Is encouraged her to continue to work on it.  Plan to follow-up in 6 months.      Relevant Orders   POCT glycosylated hemoglobin (Hb A1C) (Completed)   Acquired hypothyroidism    Recent TSH looks phenomenal at 2.0 if you like we really finally got her medication adjusted she is currently on 90 mg of Armour Thyroid.  Plan to recheck levels again in 3 to 4 months and then if that looks great we will moved every 6 months.       Other Visit Diagnoses    Seasonal allergic rhinitis due to pollen         Seasonal allergies-okay to try switching to Allegra and see if this helps.  She just recently restarted her Flonase.  Reminded her that it can take about 5 days to kick in but that should be  helpful as well and if she still suffering from allergy symptoms then please let 07/30/2019 know.  No orders of the defined types were placed in this encounter.   Follow-up:  Return in about 6 months (around 01/28/2020) for glucose and thyroid .    Nani Gasser, MD

## 2019-07-28 NOTE — Assessment & Plan Note (Signed)
Hemoglobin A1c looks great.  She brought it down to 5.7 from previous of 6.0 over the last 6 months.  Is encouraged her to continue to work on it.  Plan to follow-up in 6 months.

## 2019-07-28 NOTE — Assessment & Plan Note (Signed)
Recent TSH looks phenomenal at 2.0 if you like we really finally got her medication adjusted she is currently on 90 mg of Armour Thyroid.  Plan to recheck levels again in 3 to 4 months and then if that looks great we will moved every 6 months.

## 2019-08-02 ENCOUNTER — Other Ambulatory Visit: Payer: Self-pay | Admitting: Family Medicine

## 2019-08-05 ENCOUNTER — Encounter: Payer: Self-pay | Admitting: Family Medicine

## 2019-08-14 ENCOUNTER — Other Ambulatory Visit: Payer: Self-pay | Admitting: Family Medicine

## 2019-08-14 DIAGNOSIS — E039 Hypothyroidism, unspecified: Secondary | ICD-10-CM

## 2019-08-21 ENCOUNTER — Encounter: Payer: Self-pay | Admitting: Osteopathic Medicine

## 2019-08-21 ENCOUNTER — Ambulatory Visit (INDEPENDENT_AMBULATORY_CARE_PROVIDER_SITE_OTHER): Payer: BLUE CROSS/BLUE SHIELD | Admitting: Osteopathic Medicine

## 2019-08-21 VITALS — BP 140/99 | HR 90 | Ht 65.0 in | Wt 138.0 lb

## 2019-08-21 DIAGNOSIS — S39012A Strain of muscle, fascia and tendon of lower back, initial encounter: Secondary | ICD-10-CM | POA: Diagnosis not present

## 2019-08-21 MED ORDER — CYCLOBENZAPRINE HCL 10 MG PO TABS: 5.0000 mg | ORAL_TABLET | Freq: Three times a day (TID) | ORAL | 1 refills | Status: DC | PRN

## 2019-08-21 MED ORDER — MELOXICAM 7.5 MG PO TABS: 7.5000 mg | ORAL_TABLET | Freq: Two times a day (BID) | ORAL | 1 refills | Status: DC | PRN

## 2019-08-21 NOTE — Patient Instructions (Signed)
Meds ordered this encounter  Medications  . cyclobenzaprine (FLEXERIL) 10 MG tablet = MUSCLE RELAXER     Sig: Take 0.5-1 tablets (5-10 mg total) by mouth 3 (three) times daily as needed for muscle spasms. Caution: can cause drowsiness    Dispense:  60 tablet    Refill:  1  . meloxicam (MOBIC) 7.5 MG tablet = ANTI-INFLAMMATORY TAKE TWICE DAILY FOR 3-5 DAYS THEN AS NEEDED 1-2 TIMES PER DAY AFTER THAT     Sig: Take 1 tablet (7.5 mg total) by mouth 2 (two) times daily as needed for pain.    Dispense:  60 tablet    Refill:  1     See printed info Call Monday if no better and will send for PT   If worse/change, let us know!

## 2019-08-21 NOTE — Progress Notes (Signed)
Tina Pace is a 59 y.o. female who presents to  [facility] at [facility]  today, 08/21/19, seeking care for the following: Chief Complaint  Patient presents with  . Back Pain        ASSESSMENT & PLAN with other pertinent history/findings:  The encounter diagnosis was Strain of muscle, fascia and tendon of lower back, initial encounter.  Pain in upper lumbar area worse on the L Ongoing few weeks Worse w/ lifting, leaning forward Worse when she tried to jog No injury/trigger she can recall   On exam, no midline spinal tendnerness, (+)tightness in L upper lumbar area / QL & paraspinals    Patient Instructions   Meds ordered this encounter  Medications  . cyclobenzaprine (FLEXERIL) 10 MG tablet = MUSCLE RELAXER     Sig: Take 0.5-1 tablets (5-10 mg total) by mouth 3 (three) times daily as needed for muscle spasms. Caution: can cause drowsiness    Dispense:  60 tablet    Refill:  1  . meloxicam (MOBIC) 7.5 MG tablet = ANTI-INFLAMMATORY TAKE TWICE DAILY FOR 3-5 DAYS THEN AS NEEDED 1-2 TIMES PER DAY AFTER THAT     Sig: Take 1 tablet (7.5 mg total) by mouth 2 (two) times daily as needed for pain.    Dispense:  60 tablet    Refill:  1     See printed info Call Monday if no better and will send for PT   If worse/change, let us know!     No orders of the defined types were placed in this encounter.   Meds ordered this encounter  Medications  . cyclobenzaprine (FLEXERIL) 10 MG tablet    Sig: Take 0.5-1 tablets (5-10 mg total) by mouth 3 (three) times daily as needed for muscle spasms. Caution: can cause drowsiness    Dispense:  60 tablet    Refill:  1  . meloxicam (MOBIC) 7.5 MG tablet    Sig: Take 1 tablet (7.5 mg total) by mouth 2 (two) times daily as needed for pain.    Dispense:  60 tablet    Refill:  1       Follow-up instructions: Return if symptoms worsen or fail to  improve.                                         BP (!) 140/99   Pulse 90   Ht 5\' 5"  (1.651 m)   Wt 138 lb (62.6 kg)   BMI 22.96 kg/m   No outpatient medications have been marked as taking for the 08/21/19 encounter (Office Visit) with 10/21/19, DO.    No results found for this or any previous visit (from the past 72 hour(s)).  No results found.  Depression screen [facility] 2/9 08/21/2019 01/09/2019  Decreased Interest 0 0  Down, Depressed, Hopeless 0 0  PHQ - 2 Score 0 0    No flowsheet data found.    All questions at time of visit were answered - patient instructed to contact office with any additional concerns or updates.  ER/RTC precautions were reviewed with the patient.  Please note: voice recognition software was used to produce this document, and typos may escape review. Please contact Dr. 01/11/2019 for any needed clarifications.

## 2019-09-01 ENCOUNTER — Telehealth: Payer: Self-pay

## 2019-09-01 DIAGNOSIS — R102 Pelvic and perineal pain: Secondary | ICD-10-CM

## 2019-09-01 NOTE — Telephone Encounter (Signed)
Tameeka called and states she needs a referral to [facility]. She states she receives PT there for pelvic pain. She states the referral was originally sent by GYN. She was advised to call her PCP for future referrals. She already has an appointment scheduled. I could not find notes in her chart about the PT for pelvic pain.

## 2019-09-01 NOTE — Telephone Encounter (Signed)
Referral placed.

## 2019-09-01 NOTE — Telephone Encounter (Signed)
Ok to place referral.

## 2019-09-06 ENCOUNTER — Other Ambulatory Visit: Payer: Self-pay | Admitting: Family Medicine

## 2019-09-06 DIAGNOSIS — E039 Hypothyroidism, unspecified: Secondary | ICD-10-CM

## 2019-10-14 ENCOUNTER — Other Ambulatory Visit: Payer: Self-pay | Admitting: Family Medicine

## 2019-10-14 DIAGNOSIS — E039 Hypothyroidism, unspecified: Secondary | ICD-10-CM

## 2019-10-14 NOTE — Telephone Encounter (Signed)
Pt was contacted regarding medication refill. Pt is aware she has to do a thyroid check (per Dr. Linford Arnold last note) before next refill. Pt is requesting labs to be sent to [facility]. She stated you will be aware of the location ([address], [place], [place] [address]).

## 2019-10-16 ENCOUNTER — Other Ambulatory Visit: Payer: Self-pay

## 2019-10-16 ENCOUNTER — Other Ambulatory Visit: Payer: Self-pay | Admitting: *Deleted

## 2019-10-16 DIAGNOSIS — R5383 Other fatigue: Secondary | ICD-10-CM | POA: Diagnosis not present

## 2019-10-16 DIAGNOSIS — E039 Hypothyroidism, unspecified: Secondary | ICD-10-CM | POA: Diagnosis not present

## 2019-10-16 DIAGNOSIS — N951 Menopausal and female climacteric states: Secondary | ICD-10-CM | POA: Diagnosis not present

## 2019-10-16 MED ORDER — MELOXICAM 7.5 MG PO TABS: 7.5000 mg | ORAL_TABLET | Freq: Two times a day (BID) | ORAL | 1 refills | Status: DC | PRN

## 2019-10-16 NOTE — Telephone Encounter (Signed)
Lab for TSH placed.

## 2019-10-17 ENCOUNTER — Encounter: Payer: Self-pay | Admitting: Family Medicine

## 2019-10-20 ENCOUNTER — Encounter: Payer: Self-pay | Admitting: Family Medicine

## 2019-10-27 ENCOUNTER — Encounter: Payer: Self-pay | Admitting: Family Medicine

## 2019-10-27 DIAGNOSIS — E039 Hypothyroidism, unspecified: Secondary | ICD-10-CM | POA: Diagnosis not present

## 2019-10-27 LAB — TSH: TSH: 7.71 mIU/L — ABNORMAL HIGH (ref 0.40–4.50)

## 2019-10-27 NOTE — Telephone Encounter (Signed)
Patient called and stated that [facility] lost her samples. Requesting lab orders be faxed to [facility] in [place].   Labs faxed to [phone]

## 2019-10-28 ENCOUNTER — Encounter: Payer: Self-pay | Admitting: Family Medicine

## 2019-10-28 ENCOUNTER — Encounter: Payer: Self-pay | Admitting: *Deleted

## 2019-10-29 ENCOUNTER — Telehealth: Payer: Self-pay | Admitting: Family Medicine

## 2019-10-29 DIAGNOSIS — Z1211 Encounter for screening for malignant neoplasm of colon: Secondary | ICD-10-CM

## 2019-10-29 NOTE — Telephone Encounter (Signed)
Gi referral placed  

## 2019-11-03 ENCOUNTER — Encounter: Payer: Self-pay | Admitting: Family Medicine

## 2019-12-08 ENCOUNTER — Encounter: Payer: Self-pay | Admitting: Family Medicine

## 2019-12-08 DIAGNOSIS — E039 Hypothyroidism, unspecified: Secondary | ICD-10-CM

## 2019-12-11 ENCOUNTER — Other Ambulatory Visit: Payer: Self-pay | Admitting: Family Medicine

## 2019-12-11 DIAGNOSIS — E039 Hypothyroidism, unspecified: Secondary | ICD-10-CM | POA: Diagnosis not present

## 2019-12-12 LAB — TSH: TSH: 2.08 mIU/L (ref 0.40–4.50)

## 2019-12-23 ENCOUNTER — Other Ambulatory Visit: Payer: Self-pay | Admitting: Osteopathic Medicine

## 2019-12-26 ENCOUNTER — Encounter: Payer: Self-pay | Admitting: Family Medicine

## 2019-12-26 DIAGNOSIS — E039 Hypothyroidism, unspecified: Secondary | ICD-10-CM

## 2019-12-26 MED ORDER — THYROID 15 MG PO TABS: 15.0000 mg | ORAL_TABLET | Freq: Every day | ORAL | 0 refills | Status: DC

## 2020-01-02 ENCOUNTER — Encounter: Payer: Self-pay | Admitting: Family Medicine

## 2020-01-02 MED ORDER — ZOLMITRIPTAN 5 MG PO TABS: ORAL_TABLET | ORAL | 1 refills | Status: DC

## 2020-01-02 NOTE — Telephone Encounter (Signed)
Rx written by historical provider. Rx pended.  

## 2020-01-04 DIAGNOSIS — B349 Viral infection, unspecified: Secondary | ICD-10-CM | POA: Diagnosis not present

## 2020-01-04 DIAGNOSIS — J019 Acute sinusitis, unspecified: Secondary | ICD-10-CM | POA: Diagnosis not present

## 2020-01-07 DIAGNOSIS — J329 Chronic sinusitis, unspecified: Secondary | ICD-10-CM | POA: Diagnosis not present

## 2020-01-13 ENCOUNTER — Other Ambulatory Visit: Payer: Self-pay | Admitting: Family Medicine

## 2020-01-13 DIAGNOSIS — E039 Hypothyroidism, unspecified: Secondary | ICD-10-CM

## 2020-02-02 DIAGNOSIS — Z1231 Encounter for screening mammogram for malignant neoplasm of breast: Secondary | ICD-10-CM | POA: Diagnosis not present

## 2020-02-02 LAB — HM MAMMOGRAPHY

## 2020-02-27 ENCOUNTER — Other Ambulatory Visit: Payer: Self-pay | Admitting: Family Medicine

## 2020-02-27 ENCOUNTER — Other Ambulatory Visit: Payer: Self-pay | Admitting: *Deleted

## 2020-02-27 DIAGNOSIS — E039 Hypothyroidism, unspecified: Secondary | ICD-10-CM

## 2020-03-03 DIAGNOSIS — E039 Hypothyroidism, unspecified: Secondary | ICD-10-CM | POA: Diagnosis not present

## 2020-03-04 ENCOUNTER — Other Ambulatory Visit: Payer: Self-pay | Admitting: Nurse Practitioner

## 2020-03-04 LAB — TSH: TSH: 1.65 mIU/L (ref 0.40–4.50)

## 2020-03-24 ENCOUNTER — Other Ambulatory Visit: Payer: Self-pay | Admitting: Family Medicine

## 2020-04-28 ENCOUNTER — Other Ambulatory Visit: Payer: Self-pay | Admitting: Family Medicine

## 2020-04-28 DIAGNOSIS — E039 Hypothyroidism, unspecified: Secondary | ICD-10-CM

## 2020-04-30 ENCOUNTER — Encounter: Payer: Self-pay | Admitting: Family Medicine

## 2020-04-30 ENCOUNTER — Other Ambulatory Visit: Payer: Self-pay | Admitting: *Deleted

## 2020-06-08 ENCOUNTER — Encounter: Payer: Self-pay | Admitting: Family Medicine

## 2020-06-08 DIAGNOSIS — R102 Pelvic and perineal pain: Secondary | ICD-10-CM

## 2020-06-08 NOTE — Telephone Encounter (Signed)
Ok to place referra.  

## 2020-06-14 ENCOUNTER — Encounter: Payer: Self-pay | Admitting: Family Medicine

## 2020-06-14 ENCOUNTER — Other Ambulatory Visit: Payer: Self-pay | Admitting: *Deleted

## 2020-06-14 DIAGNOSIS — Z13220 Encounter for screening for lipoid disorders: Secondary | ICD-10-CM

## 2020-06-14 DIAGNOSIS — E039 Hypothyroidism, unspecified: Secondary | ICD-10-CM

## 2020-06-14 DIAGNOSIS — R7301 Impaired fasting glucose: Secondary | ICD-10-CM

## 2020-06-15 DIAGNOSIS — E039 Hypothyroidism, unspecified: Secondary | ICD-10-CM | POA: Diagnosis not present

## 2020-06-15 DIAGNOSIS — R7301 Impaired fasting glucose: Secondary | ICD-10-CM | POA: Diagnosis not present

## 2020-06-15 DIAGNOSIS — Z13220 Encounter for screening for lipoid disorders: Secondary | ICD-10-CM | POA: Diagnosis not present

## 2020-06-16 LAB — LIPID PANEL
Cholesterol: 204 mg/dL — ABNORMAL HIGH (ref <200)
HDL: 71 mg/dL (ref >=50)
LDL Cholesterol (Calc): 117 mg/dL (calc) — ABNORMAL HIGH
Non-HDL Cholesterol (Calc): 133 mg/dL (calc) — ABNORMAL HIGH (ref <130)
Total CHOL/HDL Ratio: 2.9 (calc) (ref <5.0)
Triglycerides: 67 mg/dL (ref <150)

## 2020-06-16 LAB — HEMOGLOBIN A1C
Hgb A1c MFr Bld: 5.8 % of total Hgb — ABNORMAL HIGH (ref <5.7)
Mean Plasma Glucose: 120 mg/dL
eAG (mmol/L): 6.6 mmol/L

## 2020-06-16 LAB — COMPLETE METABOLIC PANEL WITH GFR
AG Ratio: 1.8 (calc) (ref 1.0–2.5)
ALT: 17 U/L (ref 6–29)
AST: 18 U/L (ref 10–35)
Albumin: 4.2 g/dL (ref 3.6–5.1)
Alkaline phosphatase (APISO): 60 U/L (ref 37–153)
BUN: 12 mg/dL (ref 7–25)
CO2: 31 mmol/L (ref 20–32)
Calcium: 9.4 mg/dL (ref 8.6–10.4)
Chloride: 104 mmol/L (ref 98–110)
Creat: 0.89 mg/dL (ref 0.50–1.05)
GFR, Est African American: 83 mL/min/{1.73_m2} (ref >=60)
GFR, Est Non African American: 72 mL/min/{1.73_m2} (ref >=60)
Globulin: 2.3 g/dL (calc) (ref 1.9–3.7)
Glucose, Bld: 93 mg/dL (ref 65–99)
Potassium: 4.3 mmol/L (ref 3.5–5.3)
Sodium: 140 mmol/L (ref 135–146)
Total Bilirubin: 0.8 mg/dL (ref 0.2–1.2)
Total Protein: 6.5 g/dL (ref 6.1–8.1)

## 2020-06-16 LAB — TSH: TSH: 1.59 mIU/L (ref 0.40–4.50)

## 2020-06-21 ENCOUNTER — Encounter: Payer: Self-pay | Admitting: Family Medicine

## 2020-06-21 DIAGNOSIS — E039 Hypothyroidism, unspecified: Secondary | ICD-10-CM

## 2020-06-22 MED ORDER — THYROID 15 MG PO TABS: 15.0000 mg | ORAL_TABLET | Freq: Every day | ORAL | 3 refills | Status: DC

## 2020-06-22 MED ORDER — THYROID 90 MG PO TABS: 90.0000 mg | ORAL_TABLET | Freq: Every day | ORAL | 3 refills | Status: DC

## 2020-07-13 DIAGNOSIS — M7918 Myalgia, other site: Secondary | ICD-10-CM | POA: Diagnosis not present

## 2020-07-13 DIAGNOSIS — R102 Pelvic and perineal pain: Secondary | ICD-10-CM | POA: Diagnosis not present

## 2020-07-13 DIAGNOSIS — M62838 Other muscle spasm: Secondary | ICD-10-CM | POA: Diagnosis not present

## 2020-07-13 DIAGNOSIS — M6289 Other specified disorders of muscle: Secondary | ICD-10-CM | POA: Diagnosis not present

## 2020-07-15 DIAGNOSIS — R102 Pelvic and perineal pain: Secondary | ICD-10-CM | POA: Diagnosis not present

## 2020-07-15 DIAGNOSIS — M62838 Other muscle spasm: Secondary | ICD-10-CM | POA: Diagnosis not present

## 2020-07-15 DIAGNOSIS — M6281 Muscle weakness (generalized): Secondary | ICD-10-CM | POA: Diagnosis not present

## 2020-07-15 DIAGNOSIS — M542 Cervicalgia: Secondary | ICD-10-CM | POA: Diagnosis not present

## 2020-08-17 ENCOUNTER — Other Ambulatory Visit: Payer: Self-pay

## 2020-08-17 ENCOUNTER — Encounter: Payer: Self-pay | Admitting: Family Medicine

## 2020-08-17 ENCOUNTER — Other Ambulatory Visit (HOSPITAL_COMMUNITY)
Admission: RE | Admit: 2020-08-17 | Discharge: 2020-08-17 | Disposition: A | Discharge status: Home / Self Care | Payer: BC Managed Care – PPO | Source: Ambulatory Visit | Attending: Family Medicine | Admitting: Family Medicine

## 2020-08-17 ENCOUNTER — Ambulatory Visit (INDEPENDENT_AMBULATORY_CARE_PROVIDER_SITE_OTHER): Payer: BC Managed Care – PPO | Admitting: Family Medicine

## 2020-08-17 VITALS — BP 108/63 | HR 72 | Ht 65.0 in | Wt 123.0 lb

## 2020-08-17 DIAGNOSIS — Z23 Encounter for immunization: Secondary | ICD-10-CM | POA: Diagnosis not present

## 2020-08-17 DIAGNOSIS — Z124 Encounter for screening for malignant neoplasm of cervix: Secondary | ICD-10-CM | POA: Diagnosis not present

## 2020-08-17 DIAGNOSIS — Z0000 Encounter for general adult medical examination without abnormal findings: Secondary | ICD-10-CM | POA: Diagnosis not present

## 2020-08-17 NOTE — Progress Notes (Signed)
Subjective:     Tina Pace is a 60 y.o. female and is here for a comprehensive physical exam. The patient reports no problems.  Social History   Socioeconomic History  . Marital status: Significant Other    Spouse name: Not on file  . Number of children: Not on file  . Years of education: Not on file  . Highest education level: Not on file  Occupational History  . Occupation: Runner, broadcasting/film/video  Tobacco Use  . Smoking status: Former Smoker    Quit date: 11/22/1997    Years since quitting: 22.7  . Smokeless tobacco: Never Used  Vaping Use  . Vaping Use: Never used  Substance and Sexual Activity  . Alcohol use: Yes    Comment: OCC  . Drug use: Never  . Sexual activity: Yes    Comment: 1st intercourse- 19, More than 5 partners  Other Topics Concern  . Not on file  Social History Narrative  . Not on file   Social Determinants of Health   Financial Resource Strain: Not on file  Food Insecurity: Not on file  Transportation Needs: Not on file  Physical Activity: Not on file  Stress: Not on file  Social Connections: Not on file  Intimate Partner Violence: Not on file   Health Maintenance  Topic Date Due  . Hepatitis C Screening  Never done  . HIV Screening  Never done  . COLONOSCOPY (Pts 45-51yrs Insurance coverage will need to be confirmed)  Never done  . PAP SMEAR-Modifier  11/22/2020  . INFLUENZA VACCINE  12/13/2020  . MAMMOGRAM  02/01/2022  . TETANUS/TDAP  08/18/2030  . COVID-19 Vaccine  Completed  . HPV VACCINES  Aged Out    The following portions of the patient's history were reviewed and updated as appropriate: allergies, current medications, past family history, past medical history, past social history, past surgical history and problem list.  Review of Systems A comprehensive review of systems was negative.   Objective:    BP 108/63   Pulse 72   Ht 5\' 5"  (1.651 m)   Wt 123 lb (55.8 kg)   SpO2 98%   BMI 20.47 kg/m  General appearance: alert, cooperative  and appears stated age Head: Normocephalic, without obvious abnormality, atraumatic Eyes: conj clear, EOMi, PEERLA Ears: normal TM's and external ear canals both ears Nose: Nares normal. Septum midline. Mucosa normal. No drainage or sinus tenderness. Throat: lips, mucosa, and tongue normal; teeth and gums normal Neck: no adenopathy, no carotid bruit, no JVD, supple, symmetrical, trachea midline and thyroid not enlarged, symmetric, no tenderness/mass/nodules Back: symmetric, no curvature. ROM normal. No CVA tenderness. Lungs: clear to auscultation bilaterally Breasts: normal appearance, no masses or tenderness Heart: regular rate and rhythm, S1, S2 normal, no murmur, click, rub or gallop Abdomen: soft, non-tender; bowel sounds normal; no masses,  no organomegaly Pelvic: cervix normal in appearance, external genitalia normal, no adnexal masses or tenderness, no cervical motion tenderness, rectovaginal septum normal, uterus normal size, shape, and consistency and vagina normal without discharge Extremities: extremities normal, atraumatic, no cyanosis or edema Pulses: 2+ and symmetric Skin: Skin color, texture, turgor normal. No rashes or lesions Lymph nodes: Cervical, supraclavicular, and axillary nodes normal. Neurologic: Alert and oriented X 3, normal strength and tone. Normal symmetric reflexes. Normal coordination and gait    Assessment:    Healthy female exam.      Plan:     See After Visit Summary for Counseling Recommendations   Keep up a regular exercise  program and make sure you are eating a healthy diet Try to eat 4 servings of dairy a day, or if you are lactose intolerant take a calcium with vitamin D daily.  Your vaccines are up to date.  Labs were done in February.  Overall look good. Tdap given today.   Discussed ned for shingles. Will give at next OV.   Plans to schedule colonoscopy with Dr. Loreta Ave.

## 2020-08-17 NOTE — Patient Instructions (Signed)
Preventive Care 84-58 Years Old, Female Preventive care refers to lifestyle choices and visits with your health care provider that can promote health and wellness. This includes:  A yearly physical exam. This is also called an annual wellness visit.  Regular dental and eye exams.  Immunizations.  Screening for certain conditions.  Healthy lifestyle choices, such as: ? Eating a healthy diet. ? Getting regular exercise. ? Not using drugs or products that contain nicotine and tobacco. ? Limiting alcohol use. What can I expect for my preventive care visit? Physical exam Your health care provider will check your:  Height and weight. These may be used to calculate your BMI (body mass index). BMI is a measurement that tells if you are at a healthy weight.  Heart rate and blood pressure.  Body temperature.  Skin for abnormal spots. Counseling Your health care provider may ask you questions about your:  Past medical problems.  Family's medical history.  Alcohol, tobacco, and drug use.  Emotional well-being.  Home life and relationship well-being.  Sexual activity.  Diet, exercise, and sleep habits.  Work and work Statistician.  Access to firearms.  Method of birth control.  Menstrual cycle.  Pregnancy history. What immunizations do I need? Vaccines are usually given at various ages, according to a schedule. Your health care provider will recommend vaccines for you based on your age, medical history, and lifestyle or other factors, such as travel or where you work.   What tests do I need? Blood tests  Lipid and cholesterol levels. These may be checked every 5 years, or more often if you are over 58 years old.  Hepatitis C test.  Hepatitis B test. Screening  Lung cancer screening. You may have this screening every year starting at age 58 if you have a 30-pack-year history of smoking and currently smoke or have quit within the past 15 years.  Colorectal cancer  screening. ? All adults should have this screening starting at age 58 and continuing until age 17. ? Your health care provider may recommend screening at age 58 if you are at increased risk. ? You will have tests every 1-10 years, depending on your results and the type of screening test.  Diabetes screening. ? This is done by checking your blood sugar (glucose) after you have not eaten for a while (fasting). ? You may have this done every 1-3 years.  Mammogram. ? This may be done every 1-2 years. ? Talk with your health care provider about when you should start having regular mammograms. This may depend on whether you have a family history of breast cancer.  BRCA-related cancer screening. This may be done if you have a family history of breast, ovarian, tubal, or peritoneal cancers.  Pelvic exam and Pap test. ? This may be done every 3 years starting at age 10. ? Starting at age 11, this may be done every 5 years if you have a Pap test in combination with an HPV test. Other tests  STD (sexually transmitted disease) testing, if you are at risk.  Bone density scan. This is done to screen for osteoporosis. You may have this scan if you are at high risk for osteoporosis. Talk with your health care provider about your test results, treatment options, and if necessary, the need for more tests. Follow these instructions at home: Eating and drinking  Eat a diet that includes fresh fruits and vegetables, whole grains, lean protein, and low-fat dairy products.  Take vitamin and mineral supplements  as recommended by your health care provider.  Do not drink alcohol if: ? Your health care provider tells you not to drink. ? You are pregnant, may be pregnant, or are planning to become pregnant.  If you drink alcohol: ? Limit how much you have to 0-1 drink a day. ? Be aware of how much alcohol is in your drink. In the [place] one drink equals one 12 oz bottle of beer (355 mL), one 5 oz glass of  wine (148 mL), or one 1 oz glass of hard liquor (44 mL).   Lifestyle  Take daily care of your teeth and gums. Brush your teeth every morning and night with fluoride toothpaste. Floss one time each day.  Stay active. Exercise for at least 30 minutes 5 or more days each week.  Do not use any products that contain nicotine or tobacco, such as cigarettes, e-cigarettes, and chewing tobacco. If you need help quitting, ask your health care provider.  Do not use drugs.  If you are sexually active, practice safe sex. Use a condom or other form of protection to prevent STIs (sexually transmitted infections).  If you do not wish to become pregnant, use a form of birth control. If you plan to become pregnant, see your health care provider for a prepregnancy visit.  If told by your health care provider, take low-dose aspirin daily starting at age 50.  Find healthy ways to cope with stress, such as: ? Meditation, yoga, or listening to music. ? Journaling. ? Talking to a trusted person. ? Spending time with friends and family. Safety  Always wear your seat belt while driving or riding in a vehicle.  Do not drive: ? If you have been drinking alcohol. Do not ride with someone who has been drinking. ? When you are tired or distracted. ? While texting.  Wear a helmet and other protective equipment during sports activities.  If you have firearms in your house, make sure you follow all gun safety procedures. What's next?  Visit your health care provider once a year for an annual wellness visit.  Ask your health care provider how often you should have your eyes and teeth checked.  Stay up to date on all vaccines. This information is not intended to replace advice given to you by your health care provider. Make sure you discuss any questions you have with your health care provider. Document Revised: 02/03/2020 Document Reviewed: 01/10/2018 Elsevier Patient Education  2021 Elsevier Inc.  

## 2020-08-18 LAB — CYTOLOGY - PAP
Comment: NEGATIVE
Diagnosis: NEGATIVE
High risk HPV: NEGATIVE

## 2020-08-18 NOTE — Progress Notes (Signed)
Call patient: Your Pap smear is normal. Repeat in 5 years.

## 2020-08-23 DIAGNOSIS — M6281 Muscle weakness (generalized): Secondary | ICD-10-CM | POA: Diagnosis not present

## 2020-08-23 DIAGNOSIS — R102 Pelvic and perineal pain: Secondary | ICD-10-CM | POA: Diagnosis not present

## 2020-08-23 DIAGNOSIS — M542 Cervicalgia: Secondary | ICD-10-CM | POA: Diagnosis not present

## 2020-08-23 DIAGNOSIS — M62838 Other muscle spasm: Secondary | ICD-10-CM | POA: Diagnosis not present

## 2020-10-17 ENCOUNTER — Other Ambulatory Visit: Payer: Self-pay | Admitting: Family Medicine

## 2020-10-21 DIAGNOSIS — M62838 Other muscle spasm: Secondary | ICD-10-CM | POA: Diagnosis not present

## 2020-10-21 DIAGNOSIS — R102 Pelvic and perineal pain: Secondary | ICD-10-CM | POA: Diagnosis not present

## 2020-10-21 DIAGNOSIS — M542 Cervicalgia: Secondary | ICD-10-CM | POA: Diagnosis not present

## 2020-10-21 DIAGNOSIS — M6281 Muscle weakness (generalized): Secondary | ICD-10-CM | POA: Diagnosis not present

## 2020-10-25 ENCOUNTER — Encounter: Payer: Self-pay | Admitting: Family Medicine

## 2020-10-25 DIAGNOSIS — E039 Hypothyroidism, unspecified: Secondary | ICD-10-CM

## 2020-10-25 NOTE — Telephone Encounter (Signed)
Orders placed.

## 2020-10-26 DIAGNOSIS — E039 Hypothyroidism, unspecified: Secondary | ICD-10-CM | POA: Diagnosis not present

## 2020-10-27 LAB — TSH+FREE T4: TSH W/REFLEX TO FT4: 2.77 mIU/L (ref 0.40–4.50)

## 2020-11-01 ENCOUNTER — Encounter: Payer: Self-pay | Admitting: Family Medicine

## 2020-11-01 DIAGNOSIS — J3089 Other allergic rhinitis: Secondary | ICD-10-CM

## 2020-11-01 DIAGNOSIS — H04203 Unspecified epiphora, bilateral lacrimal glands: Secondary | ICD-10-CM

## 2020-11-01 DIAGNOSIS — R059 Cough, unspecified: Secondary | ICD-10-CM

## 2020-11-01 DIAGNOSIS — R067 Sneezing: Secondary | ICD-10-CM

## 2020-11-03 DIAGNOSIS — Z20822 Contact with and (suspected) exposure to covid-19: Secondary | ICD-10-CM | POA: Diagnosis not present

## 2020-11-03 DIAGNOSIS — J310 Chronic rhinitis: Secondary | ICD-10-CM | POA: Diagnosis not present

## 2020-11-03 DIAGNOSIS — J329 Chronic sinusitis, unspecified: Secondary | ICD-10-CM | POA: Diagnosis not present

## 2020-11-22 DIAGNOSIS — M6281 Muscle weakness (generalized): Secondary | ICD-10-CM | POA: Diagnosis not present

## 2020-11-22 DIAGNOSIS — R102 Pelvic and perineal pain: Secondary | ICD-10-CM | POA: Diagnosis not present

## 2020-11-22 DIAGNOSIS — M62838 Other muscle spasm: Secondary | ICD-10-CM | POA: Diagnosis not present

## 2020-11-22 DIAGNOSIS — M542 Cervicalgia: Secondary | ICD-10-CM | POA: Diagnosis not present

## 2020-11-30 ENCOUNTER — Encounter: Payer: Self-pay | Admitting: Family Medicine

## 2020-11-30 DIAGNOSIS — E039 Hypothyroidism, unspecified: Secondary | ICD-10-CM

## 2020-11-30 NOTE — Telephone Encounter (Signed)
OK,no problem.   Please order TSH for lab below.

## 2020-12-01 DIAGNOSIS — E039 Hypothyroidism, unspecified: Secondary | ICD-10-CM | POA: Diagnosis not present

## 2020-12-02 ENCOUNTER — Encounter: Payer: Self-pay | Admitting: Family Medicine

## 2020-12-02 LAB — TSH: TSH: 1.16 mIU/L (ref 0.40–4.50)

## 2020-12-08 ENCOUNTER — Other Ambulatory Visit: Payer: Self-pay | Admitting: Obstetrics & Gynecology

## 2020-12-08 ENCOUNTER — Ambulatory Visit: Payer: BC Managed Care – PPO | Admitting: Obstetrics & Gynecology

## 2020-12-08 ENCOUNTER — Other Ambulatory Visit: Payer: Self-pay

## 2020-12-08 ENCOUNTER — Encounter: Payer: Self-pay | Admitting: Obstetrics & Gynecology

## 2020-12-08 VITALS — BP 110/70 | HR 85 | Resp 16

## 2020-12-08 DIAGNOSIS — N952 Postmenopausal atrophic vaginitis: Secondary | ICD-10-CM | POA: Diagnosis not present

## 2020-12-08 MED ORDER — ESTRADIOL 10 MCG VA TABS: 1.0000 | ORAL_TABLET | VAGINAL | 4 refills | Status: DC

## 2020-12-08 NOTE — Progress Notes (Signed)
    Tina Pace 11-15-1960 [id number]        60 y.o.  G0P0000 Same sex partner  RP: Vaginal dryness and discomfort  HPI: Vaginal dryness and discomfort.  Discomfort with sexual activity.  No postmenopausal bleeding.  No pelvic pain.   OB History  Gravida Para Term Preterm AB Living  0 0 0 0 0 0  SAB IAB Ectopic Multiple Live Births  0 0 0 0 0    Past medical history,surgical history, problem list, medications, allergies, family history and social history were all reviewed and documented in the EPIC chart.   Directed ROS with pertinent positives and negatives documented in the history of present illness/assessment and plan.  Exam:  Vitals:   12/08/20 1541  BP: 110/70  Pulse: 85  Resp: 16   General appearance:  Normal  Abdomen: Normal  Gynecologic exam: Vulva with mild irritation.  Dryness with mild atrophy of menopause.  No evidence of other skin conditions.   Assessment/Plan:  60 y.o. G0P0000   1. 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  No other skin condition.  Decision to start on estradiol tablet vaginally twice a week.  May use it daily for 2 weeks at first to get comfortable more rapidly.  No contraindication to local estradiol.  Usage reviewed and prescription sent to pharmacy.  Recommend coconut oil as lubricant for sexual activities.  Other orders - ibuprofen (ADVIL) 400 MG tablet; Take 400 mg by mouth 3 (three) times daily. - loratadine (CLARITIN) 10 MG tablet; Take 10 mg by mouth daily. - CALCIUM PO; Take by mouth. - B Complex Vitamins (B COMPLEX PO); Take by mouth. - Estradiol 10 MCG TABS vaginal tablet; Place 1 tablet (10 mcg total) vaginally 2 (two) times a week.   [name] MD, 4:16 PM 12/08/2020

## 2020-12-09 ENCOUNTER — Telehealth: Payer: Self-pay | Admitting: *Deleted

## 2020-12-09 NOTE — Telephone Encounter (Signed)
PA done via cover my med for generic vagifem 10 mcg tablet. Will wait for response for [employer].

## 2020-12-12 ENCOUNTER — Encounter: Payer: Self-pay | Admitting: Obstetrics & Gynecology

## 2020-12-17 DIAGNOSIS — M62838 Other muscle spasm: Secondary | ICD-10-CM | POA: Diagnosis not present

## 2020-12-17 DIAGNOSIS — M542 Cervicalgia: Secondary | ICD-10-CM | POA: Diagnosis not present

## 2020-12-17 DIAGNOSIS — R102 Pelvic and perineal pain: Secondary | ICD-10-CM | POA: Diagnosis not present

## 2020-12-17 DIAGNOSIS — M6281 Muscle weakness (generalized): Secondary | ICD-10-CM | POA: Diagnosis not present

## 2020-12-21 MED ORDER — VAGIFEM 10 MCG VA TABS: 10.0000 ug | ORAL_TABLET | VAGINAL | 11 refills | Status: DC

## 2020-12-21 NOTE — Telephone Encounter (Signed)
[employer] sent fax stating estradiol vaginal tablets denied, patient will need to try and fail medication listed on formulary such as: brand Vagifem, Premarin vaginal cream, estradiol 0.1%, estradiol oral tablets, Estrogel, Divigel. I discussed with patient and she asked me to send in brand vagifem. Rx sent.

## 2021-01-03 DIAGNOSIS — M62838 Other muscle spasm: Secondary | ICD-10-CM | POA: Diagnosis not present

## 2021-01-03 DIAGNOSIS — R102 Pelvic and perineal pain: Secondary | ICD-10-CM | POA: Diagnosis not present

## 2021-01-03 DIAGNOSIS — M542 Cervicalgia: Secondary | ICD-10-CM | POA: Diagnosis not present

## 2021-01-03 DIAGNOSIS — M6281 Muscle weakness (generalized): Secondary | ICD-10-CM | POA: Diagnosis not present

## 2021-01-20 DIAGNOSIS — M542 Cervicalgia: Secondary | ICD-10-CM | POA: Diagnosis not present

## 2021-01-20 DIAGNOSIS — R102 Pelvic and perineal pain: Secondary | ICD-10-CM | POA: Diagnosis not present

## 2021-01-20 DIAGNOSIS — M62838 Other muscle spasm: Secondary | ICD-10-CM | POA: Diagnosis not present

## 2021-01-20 DIAGNOSIS — M6281 Muscle weakness (generalized): Secondary | ICD-10-CM | POA: Diagnosis not present

## 2021-01-26 ENCOUNTER — Other Ambulatory Visit: Payer: Self-pay | Admitting: Family Medicine

## 2021-01-26 DIAGNOSIS — Z1231 Encounter for screening mammogram for malignant neoplasm of breast: Secondary | ICD-10-CM

## 2021-01-28 DIAGNOSIS — R102 Pelvic and perineal pain: Secondary | ICD-10-CM | POA: Diagnosis not present

## 2021-01-28 DIAGNOSIS — M62838 Other muscle spasm: Secondary | ICD-10-CM | POA: Diagnosis not present

## 2021-01-28 DIAGNOSIS — M6281 Muscle weakness (generalized): Secondary | ICD-10-CM | POA: Diagnosis not present

## 2021-01-28 DIAGNOSIS — M6289 Other specified disorders of muscle: Secondary | ICD-10-CM | POA: Diagnosis not present

## 2021-02-08 DIAGNOSIS — M62838 Other muscle spasm: Secondary | ICD-10-CM | POA: Diagnosis not present

## 2021-02-08 DIAGNOSIS — M6281 Muscle weakness (generalized): Secondary | ICD-10-CM | POA: Diagnosis not present

## 2021-02-08 DIAGNOSIS — M6289 Other specified disorders of muscle: Secondary | ICD-10-CM | POA: Diagnosis not present

## 2021-02-08 DIAGNOSIS — R102 Pelvic and perineal pain: Secondary | ICD-10-CM | POA: Diagnosis not present

## 2021-02-11 ENCOUNTER — Encounter: Payer: Self-pay | Admitting: Family Medicine

## 2021-02-11 DIAGNOSIS — Z1211 Encounter for screening for malignant neoplasm of colon: Secondary | ICD-10-CM

## 2021-02-15 ENCOUNTER — Ambulatory Visit
Admission: RE | Admit: 2021-02-15 | Discharge: 2021-02-15 | Disposition: A | Discharge status: Home / Self Care | Payer: BC Managed Care – PPO | Source: Ambulatory Visit | Attending: Family Medicine | Admitting: Family Medicine

## 2021-02-15 ENCOUNTER — Other Ambulatory Visit: Payer: Self-pay

## 2021-02-15 DIAGNOSIS — Z1231 Encounter for screening mammogram for malignant neoplasm of breast: Secondary | ICD-10-CM | POA: Diagnosis not present

## 2021-02-17 ENCOUNTER — Ambulatory Visit: Payer: BC Managed Care – PPO | Admitting: Family Medicine

## 2021-02-17 DIAGNOSIS — M6281 Muscle weakness (generalized): Secondary | ICD-10-CM | POA: Diagnosis not present

## 2021-02-17 DIAGNOSIS — R102 Pelvic and perineal pain: Secondary | ICD-10-CM | POA: Diagnosis not present

## 2021-02-17 DIAGNOSIS — M62838 Other muscle spasm: Secondary | ICD-10-CM | POA: Diagnosis not present

## 2021-02-17 DIAGNOSIS — M542 Cervicalgia: Secondary | ICD-10-CM | POA: Diagnosis not present

## 2021-02-18 NOTE — Progress Notes (Signed)
Please call patient. Normal mammogram.  Repeat in 1 year.  

## 2021-03-14 DIAGNOSIS — M62838 Other muscle spasm: Secondary | ICD-10-CM | POA: Diagnosis not present

## 2021-03-14 DIAGNOSIS — R102 Pelvic and perineal pain: Secondary | ICD-10-CM | POA: Diagnosis not present

## 2021-03-14 DIAGNOSIS — M542 Cervicalgia: Secondary | ICD-10-CM | POA: Diagnosis not present

## 2021-03-14 DIAGNOSIS — M6281 Muscle weakness (generalized): Secondary | ICD-10-CM | POA: Diagnosis not present

## 2021-04-04 DIAGNOSIS — M6281 Muscle weakness (generalized): Secondary | ICD-10-CM | POA: Diagnosis not present

## 2021-04-04 DIAGNOSIS — R102 Pelvic and perineal pain: Secondary | ICD-10-CM | POA: Diagnosis not present

## 2021-04-04 DIAGNOSIS — M542 Cervicalgia: Secondary | ICD-10-CM | POA: Diagnosis not present

## 2021-04-04 DIAGNOSIS — M62838 Other muscle spasm: Secondary | ICD-10-CM | POA: Diagnosis not present

## 2021-04-28 DIAGNOSIS — M62838 Other muscle spasm: Secondary | ICD-10-CM | POA: Diagnosis not present

## 2021-04-28 DIAGNOSIS — M542 Cervicalgia: Secondary | ICD-10-CM | POA: Diagnosis not present

## 2021-04-28 DIAGNOSIS — M6281 Muscle weakness (generalized): Secondary | ICD-10-CM | POA: Diagnosis not present

## 2021-04-28 DIAGNOSIS — R102 Pelvic and perineal pain: Secondary | ICD-10-CM | POA: Diagnosis not present

## 2021-05-19 DIAGNOSIS — M542 Cervicalgia: Secondary | ICD-10-CM | POA: Diagnosis not present

## 2021-05-19 DIAGNOSIS — M62838 Other muscle spasm: Secondary | ICD-10-CM | POA: Diagnosis not present

## 2021-05-19 DIAGNOSIS — R102 Pelvic and perineal pain: Secondary | ICD-10-CM | POA: Diagnosis not present

## 2021-05-19 DIAGNOSIS — M6281 Muscle weakness (generalized): Secondary | ICD-10-CM | POA: Diagnosis not present

## 2021-05-24 ENCOUNTER — Ambulatory Visit: Payer: BC Managed Care – PPO | Admitting: Obstetrics & Gynecology

## 2021-05-24 DIAGNOSIS — Z0289 Encounter for other administrative examinations: Secondary | ICD-10-CM

## 2021-05-30 ENCOUNTER — Encounter: Payer: Self-pay | Admitting: Family Medicine

## 2021-05-30 DIAGNOSIS — E039 Hypothyroidism, unspecified: Secondary | ICD-10-CM

## 2021-05-31 DIAGNOSIS — E039 Hypothyroidism, unspecified: Secondary | ICD-10-CM | POA: Diagnosis not present

## 2021-06-01 LAB — TSH+FREE T4: TSH W/REFLEX TO FT4: 1.64 mIU/L (ref 0.40–4.50)

## 2021-06-01 NOTE — Progress Notes (Signed)
Your lab work is within acceptable range and there are no concerning findings.   ?

## 2021-06-16 ENCOUNTER — Other Ambulatory Visit: Payer: Self-pay | Admitting: Family Medicine

## 2021-06-16 DIAGNOSIS — E039 Hypothyroidism, unspecified: Secondary | ICD-10-CM

## 2021-06-30 DIAGNOSIS — M6281 Muscle weakness (generalized): Secondary | ICD-10-CM | POA: Diagnosis not present

## 2021-06-30 DIAGNOSIS — M62838 Other muscle spasm: Secondary | ICD-10-CM | POA: Diagnosis not present

## 2021-06-30 DIAGNOSIS — M542 Cervicalgia: Secondary | ICD-10-CM | POA: Diagnosis not present

## 2021-06-30 DIAGNOSIS — R102 Pelvic and perineal pain: Secondary | ICD-10-CM | POA: Diagnosis not present

## 2021-07-21 DIAGNOSIS — M6281 Muscle weakness (generalized): Secondary | ICD-10-CM | POA: Diagnosis not present

## 2021-07-21 DIAGNOSIS — M542 Cervicalgia: Secondary | ICD-10-CM | POA: Diagnosis not present

## 2021-07-21 DIAGNOSIS — R102 Pelvic and perineal pain: Secondary | ICD-10-CM | POA: Diagnosis not present

## 2021-07-21 DIAGNOSIS — M62838 Other muscle spasm: Secondary | ICD-10-CM | POA: Diagnosis not present

## 2021-07-28 ENCOUNTER — Other Ambulatory Visit: Payer: Self-pay | Admitting: Family Medicine

## 2021-08-02 DIAGNOSIS — Z1211 Encounter for screening for malignant neoplasm of colon: Secondary | ICD-10-CM | POA: Diagnosis not present

## 2021-08-02 DIAGNOSIS — E039 Hypothyroidism, unspecified: Secondary | ICD-10-CM | POA: Diagnosis not present

## 2021-08-02 DIAGNOSIS — Z86010 Personal history of colonic polyps: Secondary | ICD-10-CM | POA: Diagnosis not present

## 2021-08-18 DIAGNOSIS — R102 Pelvic and perineal pain: Secondary | ICD-10-CM | POA: Diagnosis not present

## 2021-08-18 DIAGNOSIS — M542 Cervicalgia: Secondary | ICD-10-CM | POA: Diagnosis not present

## 2021-08-18 DIAGNOSIS — M62838 Other muscle spasm: Secondary | ICD-10-CM | POA: Diagnosis not present

## 2021-08-18 DIAGNOSIS — M6281 Muscle weakness (generalized): Secondary | ICD-10-CM | POA: Diagnosis not present

## 2021-08-21 ENCOUNTER — Other Ambulatory Visit: Payer: Self-pay

## 2021-08-21 ENCOUNTER — Emergency Department (HOSPITAL_COMMUNITY)
Admission: EM | Admit: 2021-08-21 | Discharge: 2021-08-22 | Disposition: A | Discharge status: Home / Self Care | Payer: BC Managed Care – PPO | Attending: Emergency Medicine | Admitting: Emergency Medicine

## 2021-08-21 ENCOUNTER — Encounter (HOSPITAL_COMMUNITY): Payer: Self-pay | Admitting: Emergency Medicine

## 2021-08-21 DIAGNOSIS — W540XXA Bitten by dog, initial encounter: Secondary | ICD-10-CM | POA: Diagnosis not present

## 2021-08-21 DIAGNOSIS — S50812A Abrasion of left forearm, initial encounter: Secondary | ICD-10-CM | POA: Insufficient documentation

## 2021-08-21 DIAGNOSIS — E119 Type 2 diabetes mellitus without complications: Secondary | ICD-10-CM | POA: Diagnosis not present

## 2021-08-21 DIAGNOSIS — S59912A Unspecified injury of left forearm, initial encounter: Secondary | ICD-10-CM | POA: Diagnosis not present

## 2021-08-21 DIAGNOSIS — S61452A Open bite of left hand, initial encounter: Secondary | ICD-10-CM | POA: Diagnosis not present

## 2021-08-21 DIAGNOSIS — L03114 Cellulitis of left upper limb: Secondary | ICD-10-CM | POA: Diagnosis not present

## 2021-08-21 NOTE — ED Triage Notes (Signed)
Pt was bitten by a friend's dog last night on left wrist.  Dog is up to date on vaccinations. Pt had tetanus last year.  Pt states it was fine but then tonight noticed redness, warmth, and streaking up her arm.  ?

## 2021-08-22 ENCOUNTER — Emergency Department (HOSPITAL_COMMUNITY): Payer: BC Managed Care – PPO

## 2021-08-22 DIAGNOSIS — S61452A Open bite of left hand, initial encounter: Secondary | ICD-10-CM | POA: Diagnosis not present

## 2021-08-22 MED ORDER — AMOXICILLIN-POT CLAVULANATE 875-125 MG PO TABS
1.0000 | ORAL_TABLET | Freq: Once | ORAL | Status: AC
  Administered 2021-08-22: 1 via ORAL
  Filled 2021-08-22: qty 1

## 2021-08-22 MED ORDER — AMOXICILLIN-POT CLAVULANATE 875-125 MG PO TABS: 1.0000 | ORAL_TABLET | Freq: Two times a day (BID) | ORAL | 0 refills | Status: DC

## 2021-08-22 NOTE — ED Notes (Signed)
Discharge instructions reviewed, questions answered. Rx education provided. Pt advised to return to ED for worsening or systemic symptoms. Pt states understanding and no further questions. Pt ambulatory with steady gait upon discharge. No s/s of distress noted. ? ?

## 2021-08-22 NOTE — Discharge Instructions (Signed)
Your x-ray today was reassuring.  No foreign object. ? ?You do have cellulitis on your arm.  You are given the first dose of antibiotics here.  I have written you for a prescription.  Take as prescribed. ? ?We have outlined the cellulitis on your arm.  If you notice after being on antibiotics for 24 to 48 hours that you have worsening redness, worsening pain or noticed swelling specifically at your wrist with decreased range of motion please seek reevaluation otherwise follow-up with your primary care provider in 48 hours for a wound recheck ?

## 2021-08-22 NOTE — ED Provider Notes (Signed)
?[facility]-EMERGENCY DEPT ?Provider Note ? ? ?CSN: [id number] ?Arrival date & time: 08/21/21  2326 ? ?  ? ?History ? ?Chief Complaint  ?Patient presents with  ? Animal Bite  ? ? ?Tina Pace is a 61 y.o. female for evaluation of dog bite.  Occurred 2 friends dog yesterday.  Bite to left thenar eminence.  Dog is up-to-date on rabies.  She is up-to-date on her tetanus.  She noted tonight when she was changing she had some redness going up her arm.  No swelling, fluctuance, induration.  No decreased range of motion to joints.  No fever, emesis.  No chronic immunosuppression, history of diabetes ? ?HPI ? ?  ? ?Home Medications ?Prior to Admission medications   ?Medication Sig Start Date End Date Taking? Authorizing Provider  ?amoxicillin-clavulanate (AUGMENTIN) 875-125 MG tablet Take 1 tablet by mouth every 12 (twelve) hours. 08/22/21  Yes Gurinder Toral A, PA-C  ?ARMOUR THYROID 15 MG tablet TAKE 1 TABLET (15 MG TOTAL) BY MOUTH DAILY. 06/17/21   Agapito Games, MD  ?ARMOUR THYROID 90 MG tablet TAKE 1 TABLET BY MOUTH EVERY DAY 06/17/21   Agapito Games, MD  ?B Complex Vitamins (B COMPLEX PO) Take by mouth.    [provider]  ?CALCIUM PO Take by mouth.    [provider]  ?cholecalciferol (VITAMIN D) 1000 units tablet Take 1,000 Units by mouth daily.    [provider]  ?ibuprofen (ADVIL) 400 MG tablet Take 400 mg by mouth 3 (three) times daily. 11/03/20   [provider]  ?loratadine (CLARITIN) 10 MG tablet Take 10 mg by mouth daily.    [provider]  ?meloxicam (MOBIC) 7.5 MG tablet TAKE 1 TABLET (7.5 MG TOTAL) BY MOUTH 2 (TWO) TIMES DAILY AS NEEDED FOR PAIN. 12/23/19   Tollie Eth, NP  ?Omega-3 Fatty Acids (FISH OIL) 1000 MG CAPS Take by mouth.    [provider]  ?VAGIFEM 10 MCG TABS vaginal tablet Place 1 tablet (10 mcg total) vaginally 2 (two) times a week. 12/23/20   [name], MD  ?zolmitriptan (ZOMIG) 5 MG tablet  TAKE 1 TABLET DAILY AS NEEDED MAY REPEAT DOSE IN 1 HOUR IF NO EFFECT 07/28/21   Agapito Games, MD  ?   ? ?Allergies    ?Patient has no known allergies.   ? ?Review of Systems   ?Review of Systems  ?Constitutional: Negative.   ?HENT: Negative.    ?Respiratory: Negative.    ?Cardiovascular: Negative.   ?Gastrointestinal: Negative.   ?Genitourinary: Negative.   ?Musculoskeletal: Negative.   ?Skin:  Positive for rash.  ?Neurological: Negative.   ?All other systems reviewed and are negative. ? ?Physical Exam ?Updated Vital Signs ?BP 138/83 (BP Location: Right Arm)   Pulse 81   Temp 98.8 ?F (37.1 ?C) (Oral)   Resp 16   Ht 5\' 5"  (1.651 m)   Wt 56.7 kg   SpO2 95%   BMI 20.80 kg/m?  ?Physical Exam ?Vitals and nursing note reviewed.  ?Constitutional:   ?   General: She is not in acute distress. ?   Appearance: She is well-developed. She is not ill-appearing, toxic-appearing or diaphoretic.  ?HENT:  ?   Head: Normocephalic and atraumatic.  ?Eyes:  ?   Pupils: Pupils are equal, round, and reactive to light.  ?Cardiovascular:  ?   Rate and Rhythm: Normal rate.  ?   Pulses: Normal pulses.     ?     Radial  pulses are 2+ on the right side and 2+ on the left side.  ?   Heart sounds: Normal heart sounds.  ?Pulmonary:  ?   Effort: No respiratory distress.  ?Abdominal:  ?   General: There is no distension.  ?Musculoskeletal:     ?   General: Normal range of motion.  ?   Cervical back: Normal range of motion.  ?   Comments: No bony tenderness, full range of motion.  Left wrist without obvious effusion, edema or warmth.    ?Skin: ?   General: Skin is warm and dry.  ?   Capillary Refill: Capillary refill takes less than 2 seconds.  ?   Findings: Erythema present.  ?   Comments: Abrasion palmar aspect left thenar eminence.  Beginning streaking to left forearm, approx 14 mm.  See picture in chart, no fluctuance, induration.  No purulent discharge from bite wounds  ?Neurological:  ?   General: No focal deficit present.  ?    Mental Status: She is alert.  ?   Cranial Nerves: Cranial nerves 2-12 are intact.  ?   Sensory: Sensation is intact.  ?   Motor: Motor function is intact.  ?   Comments: Intact sensation ?Equal grip  ?Psychiatric:     ?   Mood and Affect: Mood normal.  ? ? ? ?ED Results / Procedures / Treatments   ?Labs ?(all labs ordered are listed, but only abnormal results are displayed) ?Labs Reviewed - No data to display ? ?EKG ?None ? ?Radiology ?DG Hand Complete Left ? ?Result Date: 08/22/2021 ?CLINICAL DATA:  Status post dog bite. EXAM: LEFT HAND - COMPLETE 3+ VIEW COMPARISON:  None. FINDINGS: There is no evidence of fracture or dislocation. There is no evidence of arthropathy or other focal bone abnormality. Soft tissues are unremarkable. IMPRESSION: Negative. Electronically Signed   By: Aram Candelahaddeus  Houston M.D.   On: 08/22/2021 00:56   ? ?Procedures ?Procedures  ? ? ?Medications Ordered in ED ?Medications  ?amoxicillin-clavulanate (AUGMENTIN) 875-125 MG per tablet 1 tablet (1 tablet Oral Given 08/22/21 0027)  ? ? ?ED Course/ Medical Decision Making/ A&P ?  ? ?61 year old here for evaluation of dog bite occurred yesterday for friends dog.  She is afebrile, nonseptic, not ill-appearing.  Noted tonight when changing but she had some erythema to the palmar aspect hand up into her forearm.  Dog is up-to-date on rabies.  She is up-to-date on her tetanus.  She denies any pain, decreased range of motion.  No swelling to the joints.  No systemic symptoms.  Does have some early streaking of her forearm however no palpable fluctuance, induration to suggest abscess.  I am not able to visualize any obvious retained foreign object.  We will plan on x-ray, treat with antibiotics.  Low suspicion for septic joint, necrotizing fasciitis ? ?Imaging personally viewed and interpreted: ?X-ray shows retained foreign object, fracture, dislocation ? ?Shared decision making with patient.  Pt Pref monitor at home, close follow-up with PCP.  She will  return for new or worsening symptoms.  Her wound was outlined here in the ED. Given first dose of antibiotics.  I encouraged her to follow-up in 48 hours for wound recheck, return if she develops any new, worsening symptoms.  She is agreeable. ? ?The patient has been appropriately medically screened and/or stabilized in the ED. I have low suspicion for any other emergent medical condition which would require further screening, evaluation or treatment in the ED or require inpatient management. ? ?  Patient is hemodynamically stable and in no acute distress.  Patient able to ambulate in department prior to ED.  Evaluation does not show acute pathology that would require ongoing or additional emergent interventions while in the emergency department or further inpatient treatment.  I have discussed the diagnosis with the patient and answered all questions.  Pain is been managed while in the emergency department and patient has no further complaints prior to discharge.  Patient is comfortable with plan discussed in room and is stable for discharge at this time.  I have discussed strict return precautions for returning to the emergency department.  Patient was encouraged to follow-up with PCP/specialist refer to at discharge.  ? ? ?                        ?Medical Decision Making ?Amount and/or Complexity of Data Reviewed ?Radiology: ordered and independent interpretation performed. Decision-making details documented in ED Course. ? ?Risk ?OTC drugs. ?Prescription drug management. ?Decision regarding hospitalization. ? ? ? ? ? ? ? ? ?Final Clinical Impression(s) / ED Diagnoses ?Final diagnoses:  ?Dog bite, initial encounter  ?Cellulitis of left upper extremity  ? ? ?Rx / DC Orders ?ED Discharge Orders   ? ?      Ordered  ?  amoxicillin-clavulanate (AUGMENTIN) 875-125 MG tablet  Every 12 hours       ? 08/22/21 0112  ? ?  ?  ? ?  ? ? ?  ?Airyanna Dipalma A, PA-C ?08/22/21 0114 ? ?  ?Sabas Sous, MD ?08/22/21 [phone] ? ?

## 2021-08-24 ENCOUNTER — Telehealth: Payer: Self-pay | Admitting: General Practice

## 2021-08-24 NOTE — Telephone Encounter (Signed)
Transition Care Management Unsuccessful Follow-up Telephone Call ? ?Date of discharge and from where:  08/22/21 from [facility] ? ?Attempts:  1st Attempt ? ?Reason for unsuccessful TCM follow-up call:  Unable to leave message ? ?  ?

## 2021-08-29 NOTE — Telephone Encounter (Signed)
Transition Care Management Unsuccessful Follow-up Telephone Call ? ?Date of discharge and from where:  08/22/21 from [facility] ? ?Attempts:  2nd Attempt ? ?Reason for unsuccessful TCM follow-up call:  Left voice message ? ?  ?

## 2021-08-31 NOTE — Telephone Encounter (Signed)
Transition Care Management Unsuccessful Follow-up Telephone Call ? ?Date of discharge and from where:  08/22/21 from [facility] ? ?Attempts:  3rd Attempt ? ?Reason for unsuccessful TCM follow-up call:  Left voice message ? ?  ?

## 2021-09-08 DIAGNOSIS — Z86010 Personal history of colonic polyps: Secondary | ICD-10-CM | POA: Diagnosis not present

## 2021-09-08 DIAGNOSIS — M6281 Muscle weakness (generalized): Secondary | ICD-10-CM | POA: Diagnosis not present

## 2021-09-08 DIAGNOSIS — M62838 Other muscle spasm: Secondary | ICD-10-CM | POA: Diagnosis not present

## 2021-09-08 DIAGNOSIS — Z1211 Encounter for screening for malignant neoplasm of colon: Secondary | ICD-10-CM | POA: Diagnosis not present

## 2021-09-08 DIAGNOSIS — M542 Cervicalgia: Secondary | ICD-10-CM | POA: Diagnosis not present

## 2021-09-08 DIAGNOSIS — E039 Hypothyroidism, unspecified: Secondary | ICD-10-CM | POA: Diagnosis not present

## 2021-09-08 DIAGNOSIS — R102 Pelvic and perineal pain: Secondary | ICD-10-CM | POA: Diagnosis not present

## 2021-09-12 ENCOUNTER — Encounter: Payer: Self-pay | Admitting: Family Medicine

## 2021-09-12 DIAGNOSIS — E039 Hypothyroidism, unspecified: Secondary | ICD-10-CM

## 2021-09-13 ENCOUNTER — Encounter: Payer: Self-pay | Admitting: Family Medicine

## 2021-09-13 NOTE — Progress Notes (Unsigned)
It looked so good 3 months ago that I am a little bit hesitant to make a change based off the current lab.  Would she be willing to recheck it may be in a couple weeks just to see.  If it is still elevated then absolutely we need to go up on her regimen.  But if it was just off a little that day then I just want to make sure.  Because again it looked perfect 3 months ago. ?

## 2021-09-14 ENCOUNTER — Encounter: Payer: Self-pay | Admitting: *Deleted

## 2021-09-14 DIAGNOSIS — Z860100 Personal history of colon polyps, unspecified: Secondary | ICD-10-CM | POA: Insufficient documentation

## 2021-09-14 DIAGNOSIS — Z1211 Encounter for screening for malignant neoplasm of colon: Secondary | ICD-10-CM | POA: Insufficient documentation

## 2021-09-20 ENCOUNTER — Encounter: Payer: Self-pay | Admitting: Family Medicine

## 2021-09-20 DIAGNOSIS — E559 Vitamin D deficiency, unspecified: Secondary | ICD-10-CM

## 2021-09-20 DIAGNOSIS — E039 Hypothyroidism, unspecified: Secondary | ICD-10-CM

## 2021-09-22 DIAGNOSIS — E559 Vitamin D deficiency, unspecified: Secondary | ICD-10-CM | POA: Diagnosis not present

## 2021-09-22 DIAGNOSIS — E039 Hypothyroidism, unspecified: Secondary | ICD-10-CM | POA: Diagnosis not present

## 2021-09-22 LAB — VITAMIN D 25 HYDROXY (VIT D DEFICIENCY, FRACTURES): Vit D, 25-Hydroxy: 52 ng/mL (ref 30–100)

## 2021-09-22 LAB — TSH: TSH: 2.36 mIU/L (ref 0.40–4.50)

## 2021-09-23 NOTE — Progress Notes (Signed)
Hi Tina Pace, thyroid looks better at 2.3.  I think for now we will just continue to monitor and plan to recheck again in about 4 to 6 months.  Vitamin D looks great.

## 2021-09-29 ENCOUNTER — Encounter: Payer: Self-pay | Admitting: Family Medicine

## 2021-10-18 DIAGNOSIS — M62838 Other muscle spasm: Secondary | ICD-10-CM | POA: Diagnosis not present

## 2021-10-18 DIAGNOSIS — M6281 Muscle weakness (generalized): Secondary | ICD-10-CM | POA: Diagnosis not present

## 2021-10-18 DIAGNOSIS — M6289 Other specified disorders of muscle: Secondary | ICD-10-CM | POA: Diagnosis not present

## 2021-10-18 DIAGNOSIS — R102 Pelvic and perineal pain: Secondary | ICD-10-CM | POA: Diagnosis not present

## 2021-11-01 DIAGNOSIS — M542 Cervicalgia: Secondary | ICD-10-CM | POA: Diagnosis not present

## 2021-11-01 DIAGNOSIS — M6281 Muscle weakness (generalized): Secondary | ICD-10-CM | POA: Diagnosis not present

## 2021-11-01 DIAGNOSIS — R102 Pelvic and perineal pain: Secondary | ICD-10-CM | POA: Diagnosis not present

## 2021-11-01 DIAGNOSIS — M62838 Other muscle spasm: Secondary | ICD-10-CM | POA: Diagnosis not present

## 2021-11-09 DIAGNOSIS — K648 Other hemorrhoids: Secondary | ICD-10-CM | POA: Diagnosis not present

## 2021-11-09 DIAGNOSIS — D125 Benign neoplasm of sigmoid colon: Secondary | ICD-10-CM | POA: Diagnosis not present

## 2021-11-09 DIAGNOSIS — Z1211 Encounter for screening for malignant neoplasm of colon: Secondary | ICD-10-CM | POA: Diagnosis not present

## 2021-11-09 DIAGNOSIS — K5730 Diverticulosis of large intestine without perforation or abscess without bleeding: Secondary | ICD-10-CM | POA: Diagnosis not present

## 2021-11-09 DIAGNOSIS — D120 Benign neoplasm of cecum: Secondary | ICD-10-CM | POA: Diagnosis not present

## 2021-11-09 DIAGNOSIS — K635 Polyp of colon: Secondary | ICD-10-CM | POA: Diagnosis not present

## 2021-11-09 LAB — HM COLONOSCOPY

## 2021-11-18 ENCOUNTER — Encounter: Payer: Self-pay | Admitting: Family Medicine

## 2021-12-01 DIAGNOSIS — M62838 Other muscle spasm: Secondary | ICD-10-CM | POA: Diagnosis not present

## 2021-12-01 DIAGNOSIS — M542 Cervicalgia: Secondary | ICD-10-CM | POA: Diagnosis not present

## 2021-12-01 DIAGNOSIS — M6281 Muscle weakness (generalized): Secondary | ICD-10-CM | POA: Diagnosis not present

## 2021-12-01 DIAGNOSIS — R102 Pelvic and perineal pain: Secondary | ICD-10-CM | POA: Diagnosis not present

## 2021-12-04 DIAGNOSIS — H6692 Otitis media, unspecified, left ear: Secondary | ICD-10-CM | POA: Diagnosis not present

## 2021-12-06 DIAGNOSIS — H04123 Dry eye syndrome of bilateral lacrimal glands: Secondary | ICD-10-CM | POA: Diagnosis not present

## 2021-12-06 DIAGNOSIS — H2513 Age-related nuclear cataract, bilateral: Secondary | ICD-10-CM | POA: Diagnosis not present

## 2021-12-06 DIAGNOSIS — H524 Presbyopia: Secondary | ICD-10-CM | POA: Diagnosis not present

## 2021-12-07 DIAGNOSIS — R102 Pelvic and perineal pain: Secondary | ICD-10-CM | POA: Diagnosis not present

## 2021-12-07 DIAGNOSIS — M6281 Muscle weakness (generalized): Secondary | ICD-10-CM | POA: Diagnosis not present

## 2021-12-07 DIAGNOSIS — M62838 Other muscle spasm: Secondary | ICD-10-CM | POA: Diagnosis not present

## 2021-12-07 DIAGNOSIS — M542 Cervicalgia: Secondary | ICD-10-CM | POA: Diagnosis not present

## 2021-12-12 DIAGNOSIS — M6281 Muscle weakness (generalized): Secondary | ICD-10-CM | POA: Diagnosis not present

## 2021-12-12 DIAGNOSIS — R102 Pelvic and perineal pain: Secondary | ICD-10-CM | POA: Diagnosis not present

## 2021-12-15 ENCOUNTER — Encounter: Payer: Self-pay | Admitting: Family Medicine

## 2021-12-15 DIAGNOSIS — Z0000 Encounter for general adult medical examination without abnormal findings: Secondary | ICD-10-CM

## 2021-12-15 DIAGNOSIS — E039 Hypothyroidism, unspecified: Secondary | ICD-10-CM

## 2021-12-15 DIAGNOSIS — R7301 Impaired fasting glucose: Secondary | ICD-10-CM

## 2021-12-15 NOTE — Telephone Encounter (Signed)
Routing to covering provider. Lab order pended for provider review. Thanks in advance.

## 2021-12-15 NOTE — Telephone Encounter (Signed)
Labs signed!  Thank you!                              Please see the MyChart message reply(ies) for my assessment and plan.    This patient gave consent for this Medical Advice Message and is aware that it may result in a bill to [employer], as well as the possibility of receiving a bill for a co-payment or deductible. They are an established patient, but are not seeking medical advice exclusively about a problem treated during an in person or video visit in the last seven days. I did not recommend an in person or video visit within seven days of my reply.    I spent a total of 5 minutes cumulative time within 7 days through [employer].  Rodney Langton, MD

## 2021-12-16 DIAGNOSIS — Z0000 Encounter for general adult medical examination without abnormal findings: Secondary | ICD-10-CM | POA: Diagnosis not present

## 2021-12-16 DIAGNOSIS — E039 Hypothyroidism, unspecified: Secondary | ICD-10-CM | POA: Diagnosis not present

## 2021-12-16 DIAGNOSIS — R7301 Impaired fasting glucose: Secondary | ICD-10-CM | POA: Diagnosis not present

## 2021-12-17 DIAGNOSIS — M79604 Pain in right leg: Secondary | ICD-10-CM | POA: Diagnosis not present

## 2021-12-17 LAB — COMPLETE METABOLIC PANEL WITH GFR
AG Ratio: 1.7 (calc) (ref 1.0–2.5)
ALT: 22 U/L (ref 6–29)
AST: 22 U/L (ref 10–35)
Albumin: 4.3 g/dL (ref 3.6–5.1)
Alkaline phosphatase (APISO): 66 U/L (ref 37–153)
BUN: 22 mg/dL (ref 7–25)
CO2: 31 mmol/L (ref 20–32)
Calcium: 9.9 mg/dL (ref 8.6–10.4)
Chloride: 102 mmol/L (ref 98–110)
Creat: 0.98 mg/dL (ref 0.50–1.03)
Globulin: 2.6 g/dL (calc) (ref 1.9–3.7)
Glucose, Bld: 137 mg/dL — ABNORMAL HIGH (ref 65–99)
Potassium: 4.6 mmol/L (ref 3.5–5.3)
Sodium: 140 mmol/L (ref 135–146)
Total Bilirubin: 0.5 mg/dL (ref 0.2–1.2)
Total Protein: 6.9 g/dL (ref 6.1–8.1)
eGFR: 66 mL/min/{1.73_m2} (ref >=60)

## 2021-12-17 LAB — CBC
HCT: 41.1 % (ref 35.0–45.0)
Hemoglobin: 13.7 g/dL (ref 11.7–15.5)
MCH: 29 pg (ref 27.0–33.0)
MCHC: 33.3 g/dL (ref 32.0–36.0)
MCV: 87.1 fL (ref 80.0–100.0)
MPV: 9.3 fL (ref 7.5–12.5)
Platelets: 257 10*3/uL (ref 140–400)
RBC: 4.72 10*6/uL (ref 3.80–5.10)
RDW: 13.6 % (ref 11.0–15.0)
WBC: 6.9 10*3/uL (ref 3.8–10.8)

## 2021-12-17 LAB — LIPID PANEL W/REFLEX DIRECT LDL
Cholesterol: 217 mg/dL — ABNORMAL HIGH (ref <200)
HDL: 92 mg/dL (ref >=50)
LDL Cholesterol (Calc): 109 mg/dL (calc) — ABNORMAL HIGH
Non-HDL Cholesterol (Calc): 125 mg/dL (calc) (ref <130)
Total CHOL/HDL Ratio: 2.4 (calc) (ref <5.0)
Triglycerides: 73 mg/dL (ref <150)

## 2021-12-17 LAB — HEMOGLOBIN A1C
Hgb A1c MFr Bld: 5.6 % of total Hgb (ref <5.7)
Mean Plasma Glucose: 114 mg/dL
eAG (mmol/L): 6.3 mmol/L

## 2021-12-17 LAB — TSH: TSH: 0.51 mIU/L (ref 0.40–4.50)

## 2021-12-20 ENCOUNTER — Ambulatory Visit: Payer: BC Managed Care – PPO | Admitting: Family Medicine

## 2021-12-20 ENCOUNTER — Encounter: Payer: Self-pay | Admitting: Family Medicine

## 2021-12-20 VITALS — BP 126/80 | HR 77 | Temp 98.0°F | Ht 65.0 in | Wt 133.8 lb

## 2021-12-20 DIAGNOSIS — M542 Cervicalgia: Secondary | ICD-10-CM | POA: Diagnosis not present

## 2021-12-20 DIAGNOSIS — H6592 Unspecified nonsuppurative otitis media, left ear: Secondary | ICD-10-CM

## 2021-12-20 DIAGNOSIS — J309 Allergic rhinitis, unspecified: Secondary | ICD-10-CM | POA: Diagnosis not present

## 2021-12-20 DIAGNOSIS — M6281 Muscle weakness (generalized): Secondary | ICD-10-CM | POA: Diagnosis not present

## 2021-12-20 DIAGNOSIS — H938X2 Other specified disorders of left ear: Secondary | ICD-10-CM | POA: Diagnosis not present

## 2021-12-20 DIAGNOSIS — M62838 Other muscle spasm: Secondary | ICD-10-CM | POA: Diagnosis not present

## 2021-12-20 DIAGNOSIS — F40243 Fear of flying: Secondary | ICD-10-CM | POA: Diagnosis not present

## 2021-12-20 DIAGNOSIS — R102 Pelvic and perineal pain: Secondary | ICD-10-CM | POA: Diagnosis not present

## 2021-12-20 MED ORDER — PREDNISONE 20 MG PO TABS: 40.0000 mg | ORAL_TABLET | Freq: Every day | ORAL | 0 refills | Status: DC

## 2021-12-20 MED ORDER — ALPRAZOLAM 0.5 MG PO TABS: 0.5000 mg | ORAL_TABLET | Freq: Every evening | ORAL | 0 refills | Status: DC | PRN

## 2021-12-20 MED ORDER — ALPRAZOLAM 0.5 MG PO TABS: 0.2500 mg | ORAL_TABLET | Freq: Three times a day (TID) | ORAL | 0 refills | Status: DC | PRN

## 2021-12-20 NOTE — Progress Notes (Signed)
Acute Office Visit  Subjective:     Patient ID: Tina Pace, female    DOB: 04-04-1961, 61 y.o.   MRN: [id number]  Chief Complaint  Patient presents with   Ear Pain    Bilateral era pain onset 1 month    HPI Patient is in today for bilat ear pain x 1 month. No fever.  [facility] from [facility] on July 23 indicated left otitis media.  It looks like she was prescribed Augmentin twice a day for 10-day.  She still feels like that there is just not quite right.  The pain is actually better after taking 10 days of antibiotics but it still feels full and she is noticing some ringing.  Meloxicam seems to help but she has been using that for shinsplints daily.  She is now using Allegra for allergies as well as a nasal steroid spray the switch to Allegra has been helpful but she would still like a referral back to an allergist.  Hypothyroidism-she was feeling a little brain fog and so upped her thyroid medication by adding 15 mcg 3 days a week on Mondays, Wednesdays, and Fridays.  So taking a total of an extra 45 mcg/week.  TSH was 0.51.  She did have some questions about COVID-vaccine before traveling she is leaving at the end of the month for extended travel.  She will have a long flight and then a shorter layover and flight.  She would like to have a prescription for medication to take for flying if she has used it before.  ROS      Objective:    BP 126/80   Pulse 77   Temp 98 F (36.7 C) (Oral)   Ht 5\' 5"  (1.651 m)   Wt 133 lb 12.8 oz (60.7 kg)   SpO2 99%   BMI 22.27 kg/m    Physical Exam Constitutional:      Appearance: She is well-developed.  HENT:     Head: Normocephalic and atraumatic.     Right Ear: Tympanic membrane, ear canal and external ear normal.     Left Ear: Tympanic membrane, ear canal and external ear normal.     Ears:     Comments: Mall effusion behind the left TM otherwise normal appearance    Nose: Nose normal.  Eyes:     Conjunctiva/sclera: Conjunctivae  normal.     Pupils: Pupils are equal, round, and reactive to light.  Neck:     Thyroid: No thyromegaly.  Cardiovascular:     Rate and Rhythm: Normal rate and regular rhythm.     Heart sounds: Normal heart sounds.  Pulmonary:     Effort: Pulmonary effort is normal.     Breath sounds: No wheezing.  Musculoskeletal:     Cervical back: Neck supple.  Lymphadenopathy:     Cervical: No cervical adenopathy.  Skin:    General: Skin is warm and dry.  Neurological:     Mental Status: She is alert and oriented to person, place, and time.     No results found for any visits on 12/20/21.      Assessment & Plan:   Problem List Items Addressed This Visit       Respiratory   Allergic rhinitis   Relevant Orders   Ambulatory referral to Allergy   Other Visit Diagnoses     Fear of flying    -  Primary   Relevant Medications   ALPRAZolam (XANAX) 0.5 MG tablet   Middle  ear effusion, left       Ear fullness, left           Left ear effusion-status post infection.  She is already using a nasal steroid spray so we discussed doing a round of prednisone to see if it is helpful.  If symptoms do not resolve then recommend referral to ENT for further workup.  Fear of flying-as needed alprazolam provided.  Can also take a half a tab if needed.  Would recommend getting updated COVID-vaccine before travel.  Recommend getting after completing the prednisone course.  Meds ordered this encounter  Medications   predniSONE (DELTASONE) 20 MG tablet    Sig: Take 2 tablets (40 mg total) by mouth daily with breakfast.    Dispense:  10 tablet    Refill:  0   DISCONTD: ALPRAZolam (XANAX) 0.5 MG tablet    Sig: Take 1 tablet (0.5 mg total) by mouth at bedtime as needed for anxiety.    Dispense:  6 tablet    Refill:  0   ALPRAZolam (XANAX) 0.5 MG tablet    Sig: Take 0.5-1 tablets (0.25-0.5 mg total) by mouth 3 (three) times daily as needed for anxiety. For flight anxiety    Dispense:  6 tablet     Refill:  0    No follow-ups on file.  Nani Gasser, MD

## 2021-12-27 DIAGNOSIS — M79604 Pain in right leg: Secondary | ICD-10-CM | POA: Diagnosis not present

## 2022-01-01 ENCOUNTER — Encounter: Payer: Self-pay | Admitting: Family Medicine

## 2022-01-02 NOTE — Telephone Encounter (Signed)
Okay to put in an acute sometime this week to recheck her ear before she flies.  Specially since she is noticing some drainage.

## 2022-01-03 ENCOUNTER — Ambulatory Visit: Payer: BC Managed Care – PPO | Admitting: Family Medicine

## 2022-01-03 NOTE — Telephone Encounter (Signed)
Pt scheduled for 01/05/2022 with Dr. Linford Arnold.  Tiajuana Amass, CMA

## 2022-01-05 ENCOUNTER — Ambulatory Visit: Payer: BC Managed Care – PPO | Admitting: Family Medicine

## 2022-01-05 ENCOUNTER — Encounter: Payer: Self-pay | Admitting: Family Medicine

## 2022-01-05 VITALS — BP 123/76 | HR 67 | Temp 98.4°F | Wt 134.0 lb

## 2022-01-05 DIAGNOSIS — H938X2 Other specified disorders of left ear: Secondary | ICD-10-CM | POA: Diagnosis not present

## 2022-01-05 MED ORDER — PREDNISONE 20 MG PO TABS: 40.0000 mg | ORAL_TABLET | Freq: Every day | ORAL | 0 refills | Status: DC

## 2022-01-05 NOTE — Progress Notes (Signed)
   Acute Office Visit  Subjective:     Patient ID: Tina Pace, female    DOB: [birth year], 61 y.o.   MRN: [id number]  Chief Complaint  Patient presents with   Ear Pain    HPI Patient is in today for left ear pain.  I saw her on August 8 and she was having some left ear discomfort at that time.  There was an effusion present and she was already using a nasal steroid spray as well as some oral Allegra.  We decided to treat with a round of prednisone.  She did get relief but then it started to come back.  Now it just feels full and uncomfortable again and she still had a lot of drainage.  Still taking allergy medications regularly.  Planning on flying out next Thursday.  Received COVID-vaccine earlier this week and did well so far.  ROS      Objective:    BP 123/76   Pulse 67   Temp 98.4 F (36.9 C) (Oral)   Wt 134 lb (60.8 kg)   SpO2 100%   BMI 22.30 kg/m    Physical Exam Constitutional:      Appearance: She is well-developed.  HENT:     Head: Normocephalic and atraumatic.     Right Ear: Tympanic membrane, ear canal and external ear normal.     Left Ear: Tympanic membrane, ear canal and external ear normal.     Nose: Nose normal.  Eyes:     Conjunctiva/sclera: Conjunctivae normal.     Pupils: Pupils are equal, round, and reactive to light.  Neck:     Thyroid: No thyromegaly.  Pulmonary:     Effort: Pulmonary effort is normal.     Breath sounds: No wheezing.  Musculoskeletal:     Cervical back: Neck supple.  Lymphadenopathy:     Cervical: No cervical adenopathy.  Skin:    General: Skin is warm and dry.  Neurological:     Mental Status: She is alert and oriented to person, place, and time.     No results found for any visits on 01/05/22.      Assessment & Plan:   Problem List Items Addressed This Visit   None Visit Diagnoses     Ear fullness, left    -  Primary       Continue allergy medications we will repeat prednisone course since she does  fly out next Thursday hopefully will provide some relief before travel.  She does have a follow-up appointment coming up with the allergist in about 4 weeks so that be a perfect time to follow-up on that ear since it did respond to the prednisone that actually seems reassuring that there could certainly be some type of inflammation or allergic component driving the symptoms.  Also consider ENT referral if needed.  Meds ordered this encounter  Medications   predniSONE (DELTASONE) 20 MG tablet    Sig: Take 2 tablets (40 mg total) by mouth daily with breakfast.    Dispense:  10 tablet    Refill:  0    No follow-ups on file.  Nani Gasser, MD

## 2022-01-10 DIAGNOSIS — M79604 Pain in right leg: Secondary | ICD-10-CM | POA: Diagnosis not present

## 2022-01-31 ENCOUNTER — Encounter: Payer: Self-pay | Admitting: Family Medicine

## 2022-01-31 ENCOUNTER — Telehealth: Payer: BC Managed Care – PPO | Admitting: Family Medicine

## 2022-01-31 DIAGNOSIS — U071 COVID-19: Secondary | ICD-10-CM

## 2022-01-31 MED ORDER — FLUTICASONE PROPIONATE 50 MCG/ACT NA SUSP: 2.0000 | Freq: Every day | NASAL | 0 refills | Status: AC

## 2022-01-31 MED ORDER — MOLNUPIRAVIR EUA 200MG CAPSULE: 4.0000 | ORAL_CAPSULE | Freq: Two times a day (BID) | ORAL | 0 refills | Status: AC

## 2022-01-31 MED ORDER — PROMETHAZINE-DM 6.25-15 MG/5ML PO SYRP: 5.0000 mL | ORAL_SOLUTION | Freq: Four times a day (QID) | ORAL | 0 refills | Status: DC | PRN

## 2022-01-31 NOTE — Patient Instructions (Signed)
Tina Pace, thank you for joining Perlie Mayo, NP for today's virtual visit.  While this provider is not your primary care provider (PCP), if your PCP is located in our provider database this encounter information will be shared with them immediately following your visit.  Consent: (Patient) Tina Pace provided verbal consent for this virtual visit at the beginning of the encounter.  Current Medications:  Current Outpatient Medications:    ALPRAZolam (XANAX) 0.5 MG tablet, Take 0.5-1 tablets (0.25-0.5 mg total) by mouth 3 (three) times daily as needed for anxiety. For flight anxiety, Disp: 6 tablet, Rfl: 0   ARMOUR THYROID 15 MG tablet, TAKE 1 TABLET (15 MG TOTAL) BY MOUTH DAILY., Disp: 90 tablet, Rfl: 3   ARMOUR THYROID 90 MG tablet, TAKE 1 TABLET BY MOUTH EVERY DAY, Disp: 90 tablet, Rfl: 3   B Complex Vitamins (B COMPLEX PO), Take by mouth., Disp: , Rfl:    CALCIUM PO, Take by mouth., Disp: , Rfl:    cholecalciferol (VITAMIN D) 1000 units tablet, Take 1,000 Units by mouth daily., Disp: , Rfl:    ibuprofen (ADVIL) 400 MG tablet, Take 400 mg by mouth 3 (three) times daily., Disp: , Rfl:    loratadine (CLARITIN) 10 MG tablet, Take 10 mg by mouth daily., Disp: , Rfl:    meloxicam (MOBIC) 7.5 MG tablet, TAKE 1 TABLET (7.5 MG TOTAL) BY MOUTH 2 (TWO) TIMES DAILY AS NEEDED FOR PAIN., Disp: 60 tablet, Rfl: 1   Omega-3 Fatty Acids (FISH OIL) 1000 MG CAPS, Take by mouth., Disp: , Rfl:    predniSONE (DELTASONE) 20 MG tablet, Take 2 tablets (40 mg total) by mouth daily with breakfast., Disp: 10 tablet, Rfl: 0   VAGIFEM 10 MCG TABS vaginal tablet, Place 1 tablet (10 mcg total) vaginally 2 (two) times a week., Disp: 8 tablet, Rfl: 11   zolmitriptan (ZOMIG) 5 MG tablet, TAKE 1 TABLET DAILY AS NEEDED MAY REPEAT DOSE IN 1 HOUR IF NO EFFECT, Disp: 10 tablet, Rfl: 1   Medications ordered in this encounter:  No orders of the defined types were placed in this encounter.    *If you need refills  on other medications prior to your next appointment, please contact your pharmacy*  Follow-Up: Call back or seek an in-person evaluation if the symptoms worsen or if the condition fails to improve as anticipated.  Other Instructions  Please keep well-hydrated and get plenty of rest. Start a saline nasal rinse to flush out your nasal passages. You can use plain Mucinex to help thin congestion. If you have a humidifier, running in the bedroom at night. I want you to start OTC vitamin D3 1000 units daily, vitamin C 1000 mg daily, and a zinc supplement. Please take prescribed medications as directed.  You have been enrolled in a MyChart symptom monitoring program. Please answer these questions daily so we can keep track of how you are doing.  You were to quarantine for 5 days from onset of your symptoms.  After day 5, if you have had no fever and you are feeling better, you can end quarantine but need to mask for an additional 5 days. After day 5 if you have a fever or are having significant symptoms, please quarantine for full 10 days.  If you note any worsening of symptoms, any significant shortness of breath or any chest pain, please seek ER evaluation ASAP.  Please do not delay care!  COVID-19: What to Do if You Are Sick If  you test positive and are an older adult or someone who is at high risk of getting very sick from COVID-19, treatment may be available. Contact a healthcare provider right away after a positive test to determine if you are eligible, even if your symptoms are mild right now. You can also visit a Test to Treat location and, if eligible, receive a prescription from a provider. Don't delay: Treatment must be started within the first few days to be effective. If you have a fever, cough, or other symptoms, you might have COVID-19. Most people have mild illness and are able to recover at home. If you are sick: Keep track of your symptoms. If you have an emergency warning sign  (including trouble breathing), call 911. Steps to help prevent the spread of COVID-19 if you are sick If you are sick with COVID-19 or think you might have COVID-19, follow the steps below to care for yourself and to help protect other people in your home and community. Stay home except to get medical care Stay home. Most people with COVID-19 have mild illness and can recover at home without medical care. Do not leave your home, except to get medical care. Do not visit public areas and do not go to places where you are unable to wear a mask. Take care of yourself. Get rest and stay hydrated. Take over-the-counter medicines, such as acetaminophen, to help you feel better. Stay in touch with your doctor. Call before you get medical care. Be sure to get care if you have trouble breathing, or have any other emergency warning signs, or if you think it is an emergency. Avoid public transportation, ride-sharing, or taxis if possible. Get tested If you have symptoms of COVID-19, get tested. While waiting for test results, stay away from others, including staying apart from those living in your household. Get tested as soon as possible after your symptoms start. Treatments may be available for people with COVID-19 who are at risk for becoming very sick. Don't delay: Treatment must be started early to be effective--some treatments must begin within 5 days of your first symptoms. Contact your healthcare provider right away if your test result is positive to determine if you are eligible. Self-tests are one of several options for testing for the virus that causes COVID-19 and may be more convenient than laboratory-based tests and point-of-care tests. Ask your healthcare provider or your local health department if you need help interpreting your test results. You can visit your state, tribal, local, and territorial health department's website to look for the latest local information on testing sites. Separate  yourself from other people As much as possible, stay in a specific room and away from other people and pets in your home. If possible, you should use a separate bathroom. If you need to be around other people or animals in or outside of the home, wear a well-fitting mask. Tell your close contacts that they may have been exposed to COVID-19. An infected person can spread COVID-19 starting 48 hours (or 2 days) before the person has any symptoms or tests positive. By letting your close contacts know they may have been exposed to COVID-19, you are helping to protect everyone. See COVID-19 and Animals if you have questions about pets. If you are diagnosed with COVID-19, someone from the health department may call you. Answer the call to slow the spread. Monitor your symptoms Symptoms of COVID-19 include fever, cough, or other symptoms. Follow care instructions from your healthcare provider  and local health department. Your local health authorities may give instructions on checking your symptoms and reporting information. When to seek emergency medical attention Look for emergency warning signs* for COVID-19. If someone is showing any of these signs, seek emergency medical care immediately: Trouble breathing Persistent pain or pressure in the chest New confusion Inability to wake or stay awake Pale, gray, or blue-colored skin, lips, or nail beds, depending on skin tone *This list is not all possible symptoms. Please call your medical provider for any other symptoms that are severe or concerning to you. Call 911 or call ahead to your local emergency facility: Notify the operator that you are seeking care for someone who has or may have COVID-19. Call ahead before visiting your doctor Call ahead. Many medical visits for routine care are being postponed or done by phone or telemedicine. If you have a medical appointment that cannot be postponed, call your doctor's office, and tell them you have or may have  COVID-19. This will help the office protect themselves and other patients. If you are sick, wear a well-fitting mask You should wear a mask if you must be around other people or animals, including pets (even at home). Wear a mask with the best fit, protection, and comfort for you. You don't need to wear the mask if you are alone. If you can't put on a mask (because of trouble breathing, for example), cover your coughs and sneezes in some other way. Try to stay at least 6 feet away from other people. This will help protect the people around you. Masks should not be placed on young children under age 47 years, anyone who has trouble breathing, or anyone who is not able to remove the mask without help. Cover your coughs and sneezes Cover your mouth and nose with a tissue when you cough or sneeze. Throw away used tissues in a lined trash can. Immediately wash your hands with soap and water for at least 20 seconds. If soap and water are not available, clean your hands with an alcohol-based hand sanitizer that contains at least 60% alcohol. Clean your hands often Wash your hands often with soap and water for at least 20 seconds. This is especially important after blowing your nose, coughing, or sneezing; going to the bathroom; and before eating or preparing food. Use hand sanitizer if soap and water are not available. Use an alcohol-based hand sanitizer with at least 60% alcohol, covering all surfaces of your hands and rubbing them together until they feel dry. Soap and water are the best option, especially if hands are visibly dirty. Avoid touching your eyes, nose, and mouth with unwashed hands. Handwashing Tips Avoid sharing personal household items Do not share dishes, drinking glasses, cups, eating utensils, towels, or bedding with other people in your home. Wash these items thoroughly after using them with soap and water or put in the dishwasher. Clean surfaces in your home regularly Clean and  disinfect high-touch surfaces (for example, doorknobs, tables, handles, light switches, and countertops) in your "sick room" and bathroom. In shared spaces, you should clean and disinfect surfaces and items after each use by the person who is ill. If you are sick and cannot clean, a caregiver or other person should only clean and disinfect the area around you (such as your bedroom and bathroom) on an as needed basis. Your caregiver/other person should wait as long as possible (at least several hours) and wear a mask before entering, cleaning, and disinfecting shared spaces  that you use. Clean and disinfect areas that may have blood, stool, or body fluids on them. Use household cleaners and disinfectants. Clean visible dirty surfaces with household cleaners containing soap or detergent. Then, use a household disinfectant. Use a product from [employer] List N: Disinfectants for Coronavirus (COVID-19). Be sure to follow the instructions on the label to ensure safe and effective use of the product. Many products recommend keeping the surface wet with a disinfectant for a certain period of time (look at "contact time" on the product label). You may also need to wear personal protective equipment, such as gloves, depending on the directions on the product label. Immediately after disinfecting, wash your hands with soap and water for 20 seconds. For completed guidance on cleaning and disinfecting your home, visit Complete Disinfection Guidance. Take steps to improve ventilation at home Improve ventilation (air flow) at home to help prevent from spreading COVID-19 to other people in your household. Clear out COVID-19 virus particles in the air by opening windows, using air filters, and turning on fans in your home. Use this interactive tool to learn how to improve air flow in your home. When you can be around others after being sick with COVID-19 Deciding when you can be around others is different for different  situations. Find out when you can safely end home isolation. For any additional questions about your care, contact your healthcare provider or state or local health department. 08/03/2020 Content source: [facility] ([facility]), Division of Viral Diseases This information is not intended to replace advice given to you by your health care provider. Make sure you discuss any questions you have with your health care provider. Document Revised: 09/16/2020 Document Reviewed: 09/16/2020 Elsevier Patient Education  2022 [employer].      If you have been instructed to have an in-person evaluation today at a local Urgent Care facility, please use the link below. It will take you to a list of all of our available [facility], including address, phone number and hours of operation. Please do not delay care.  [facility]  If you or a family member do not have a primary care provider, use the link below to schedule a visit and establish care. When you choose a [facility] primary care physician or advanced practice provider, you gain a long-term partner in health. Find a Primary Care Provider  Learn more about [facility]'s in-office and virtual care options: [facility] - Get Care Now

## 2022-01-31 NOTE — Progress Notes (Signed)
Virtual Visit Consent   Tina Pace, you are scheduled for a virtual visit with a [facility] provider today. Just as with appointments in the office, your consent must be obtained to participate. Your consent will be active for this visit and any virtual visit you may have with one of our providers in the next 365 days. If you have a MyChart account, a copy of this consent can be sent to you electronically.  As this is a virtual visit, video technology does not allow for your provider to perform a traditional examination. This may limit your provider's ability to fully assess your condition. If your provider identifies any concerns that need to be evaluated in person or the need to arrange testing (such as labs, EKG, etc.), we will make arrangements to do so. Although advances in technology are sophisticated, we cannot ensure that it will always work on either your end or our end. If the connection with a video visit is poor, the visit may have to be switched to a telephone visit. With either a video or telephone visit, we are not always able to ensure that we have a secure connection.  By engaging in this virtual visit, you consent to the provision of healthcare and authorize for your insurance to be billed (if applicable) for the services provided during this visit. Depending on your insurance coverage, you may receive a charge related to this service.  I need to obtain your verbal consent now. Are you willing to proceed with your visit today? Tina Pace has provided verbal consent on 01/31/2022 for a virtual visit (video or telephone). Freddy Finner, NP  Date: 01/31/2022 2:42 PM  Virtual Visit via Video Note   I, Freddy Finner, connected with  Tina Pace  ([id number], 10-19-1960) on 01/31/22 at  3:00 PM EDT by a video-enabled telemedicine application and verified that I am speaking with the correct person using two identifiers.  Location: Patient: Virtual Visit Location Patient:  Home Provider: Virtual Visit Location Provider: Home Office   I discussed the limitations of evaluation and management by telemedicine and the availability of in person appointments. The patient expressed understanding and agreed to proceed.    History of Present Illness: Tina Pace is a 61 y.o. who identifies as a female who was assigned female at birth, and is being seen today for COVID. symptoms started late Sunday- tired. Progressed to cough-sputum at times, congestion, nasal congestion, sore throat and ear pain, chills, and fever t max 99.9 on tylenol.   Denies chest pain, shortness of breath.  Was in [place] last week and flew home Monday morning- covid + on home test.  Has never had COVID Has done vaccines as well    Problems:  Patient Active Problem List   Diagnosis Date Noted   Allergic rhinitis 12/20/2021   Personal history of colonic polyps 09/14/2021   Colon cancer screening 09/14/2021   IFG (impaired fasting glucose) 01/30/2019   Migraine without aura and without status migrainosus, not intractable 01/10/2019   Acquired hypothyroidism 12/27/2015   Rupture of ulnar collateral ligament of left thumb 04/27/2014    Allergies: No Known Allergies Medications:  Current Outpatient Medications:    ALPRAZolam (XANAX) 0.5 MG tablet, Take 0.5-1 tablets (0.25-0.5 mg total) by mouth 3 (three) times daily as needed for anxiety. For flight anxiety, Disp: 6 tablet, Rfl: 0   ARMOUR THYROID 15 MG tablet, TAKE 1 TABLET (15 MG TOTAL) BY MOUTH DAILY., Disp: 90 tablet,  Rfl: 3   ARMOUR THYROID 90 MG tablet, TAKE 1 TABLET BY MOUTH EVERY DAY, Disp: 90 tablet, Rfl: 3   B Complex Vitamins (B COMPLEX PO), Take by mouth., Disp: , Rfl:    CALCIUM PO, Take by mouth., Disp: , Rfl:    cholecalciferol (VITAMIN D) 1000 units tablet, Take 1,000 Units by mouth daily., Disp: , Rfl:    ibuprofen (ADVIL) 400 MG tablet, Take 400 mg by mouth 3 (three) times daily., Disp: , Rfl:    loratadine (CLARITIN) 10  MG tablet, Take 10 mg by mouth daily., Disp: , Rfl:    meloxicam (MOBIC) 7.5 MG tablet, TAKE 1 TABLET (7.5 MG TOTAL) BY MOUTH 2 (TWO) TIMES DAILY AS NEEDED FOR PAIN., Disp: 60 tablet, Rfl: 1   Omega-3 Fatty Acids (FISH OIL) 1000 MG CAPS, Take by mouth., Disp: , Rfl:    predniSONE (DELTASONE) 20 MG tablet, Take 2 tablets (40 mg total) by mouth daily with breakfast., Disp: 10 tablet, Rfl: 0   VAGIFEM 10 MCG TABS vaginal tablet, Place 1 tablet (10 mcg total) vaginally 2 (two) times a week., Disp: 8 tablet, Rfl: 11   zolmitriptan (ZOMIG) 5 MG tablet, TAKE 1 TABLET DAILY AS NEEDED MAY REPEAT DOSE IN 1 HOUR IF NO EFFECT, Disp: 10 tablet, Rfl: 1  Observations/Objective: Patient is well-developed, well-nourished in no acute distress.  Resting comfortably  at home.  Head is normocephalic, atraumatic.  No labored breathing.  Speech is clear and coherent with logical content.  Patient is alert and oriented at baseline.    Assessment and Plan:  1. COVID-19  - fluticasone (FLONASE) 50 MCG/ACT nasal spray; Place 2 sprays into both nostrils daily.  Dispense: 16 g; Refill: 0 - molnupiravir EUA (LAGEVRIO) 200 mg CAPS capsule; Take 4 capsules (800 mg total) by mouth 2 (two) times daily for 5 days.  Dispense: 40 capsule; Refill: 0 - promethazine-dextromethorphan (PROMETHAZINE-DM) 6.25-15 MG/5ML syrup; Take 5 mLs by mouth 4 (four) times daily as needed for cough.  Dispense: 118 mL; Refill: 0  -rest -hydrate -covid info reviewed and on avs -treatment with antiviral due to family members who are immune compromised -OTC reviewed as well   Reviewed side effects, risks and benefits of medication.    Patient acknowledged agreement and understanding of the plan.   Past Medical, Surgical, Social History, Allergies, and Medications have been Reviewed.   Follow Up Instructions: I discussed the assessment and treatment plan with the patient. The patient was provided an opportunity to ask questions and all  were answered. The patient agreed with the plan and demonstrated an understanding of the instructions.  A copy of instructions were sent to the patient via MyChart unless otherwise noted below.     The patient was advised to call back or seek an in-person evaluation if the symptoms worsen or if the condition fails to improve as anticipated.  Time:  I spent 15 minutes with the patient via telehealth technology discussing the above problems/concerns.    Perlie Mayo, NP

## 2022-02-10 ENCOUNTER — Ambulatory Visit: Payer: BC Managed Care – PPO | Admitting: Internal Medicine

## 2022-02-20 ENCOUNTER — Telehealth (INDEPENDENT_AMBULATORY_CARE_PROVIDER_SITE_OTHER): Payer: BC Managed Care – PPO | Admitting: Family Medicine

## 2022-02-20 ENCOUNTER — Encounter: Payer: Self-pay | Admitting: Family Medicine

## 2022-02-20 VITALS — Ht 65.0 in | Wt 134.0 lb

## 2022-02-20 DIAGNOSIS — E039 Hypothyroidism, unspecified: Secondary | ICD-10-CM

## 2022-02-20 DIAGNOSIS — Z23 Encounter for immunization: Secondary | ICD-10-CM

## 2022-02-20 MED ORDER — THYROID 15 MG PO TABS: ORAL_TABLET | ORAL | 3 refills | Status: DC

## 2022-02-20 NOTE — Progress Notes (Signed)
Some fatigue post Covid no other issues  Wants to discuss thyroid medication, she is running out early after increasing dose.  At visit in August: Hypothyroidism-she was feeling a little brain fog and so upped her thyroid medication by adding 15 mcg 3 days a week on Mondays, Wednesdays, and Fridays.  So taking a total of an extra 45 mcg/week.  TSH was 0.51 (12/16/2021).

## 2022-02-20 NOTE — Assessment & Plan Note (Signed)
Feels well on current dose. Will refill medications. Plan to recheck TSH in 3 mo Monitor fr hyperthyroid sxs since TSH 0.5.

## 2022-02-20 NOTE — Progress Notes (Signed)
    Virtual Visit via Video Note  I connected with Tina Pace on 02/20/22 at 11:30 AM EDT by a video enabled telemedicine application and verified that I am speaking with the correct person using two identifiers.   I discussed the limitations of evaluation and management by telemedicine and the availability of in person appointments. The patient expressed understanding and agreed to proceed.  Patient location: at home Provider location: in office  Subjective:    CC:   Chief Complaint  Patient presents with   Follow-up    HPI:  Some fatigue post Covid no other issues.  She is feeing much better.    Wants to discuss thyroid medication, she is running out early after increasing dose.  At visit in August: Hypothyroidism-she was feeling a little brain fog and so upped her thyroid medication by adding 15 mcg 3 days a week on Mondays, Wednesdays, and Fridays.  So taking a total of an extra 45 mcg/week.  TSH was 0.51 (12/16/2021).   Past medical history, Surgical history, Family history not pertinant except as noted below, Social history, Allergies, and medications have been entered into the medical record, reviewed, and corrections made.    Objective:    General: Speaking clearly in complete sentences without any shortness of breath.  Alert and oriented x3.  Normal judgment. No apparent acute distress.    Impression and Recommendations:    Problem List Items Addressed This Visit       Endocrine   Acquired hypothyroidism    Feels well on current dose. Will refill medications. Plan to recheck TSH in 3 mo Monitor fr hyperthyroid sxs since TSH 0.5.        Relevant Medications   thyroid (ARMOUR THYROID) 15 MG tablet   Other Visit Diagnoses     Need for shingles vaccine    -  Primary      Discussed need for shingles vaccine.    No orders of the defined types were placed in this encounter.   Meds ordered this encounter  Medications   thyroid (ARMOUR THYROID) 15 MG  tablet    Sig: 15 mg PO 3 days per week.    Dispense:  36 tablet    Refill:  3    I discussed the assessment and treatment plan with the patient. The patient was provided an opportunity to ask questions and all were answered. The patient agreed with the plan and demonstrated an understanding of the instructions.   The patient was advised to call back or seek an in-person evaluation if the symptoms worsen or if the condition fails to improve as anticipated.   Beatrice Lecher, MD

## 2022-02-25 ENCOUNTER — Other Ambulatory Visit: Payer: Self-pay | Admitting: Obstetrics & Gynecology

## 2022-02-27 NOTE — Telephone Encounter (Signed)
Med refill request: Vagifem 10 mcg vag tab  Last AEX:05/23/19 ML OV 12/08/20 ML  Next AEX: Not scheduled   Last MMG (if hormonal med) 02/15/21, Birads 1 neg   Routing refill request to Dr. Dellis Filbert to review.

## 2022-03-21 ENCOUNTER — Telehealth: Payer: BC Managed Care – PPO | Admitting: Family Medicine

## 2022-03-21 ENCOUNTER — Encounter: Payer: Self-pay | Admitting: Family Medicine

## 2022-03-21 VITALS — Wt 134.0 lb

## 2022-03-21 DIAGNOSIS — M79644 Pain in right finger(s): Secondary | ICD-10-CM | POA: Diagnosis not present

## 2022-03-21 DIAGNOSIS — E039 Hypothyroidism, unspecified: Secondary | ICD-10-CM | POA: Diagnosis not present

## 2022-03-21 MED ORDER — THYROID 15 MG PO TABS: ORAL_TABLET | ORAL | 1 refills | Status: DC

## 2022-03-21 NOTE — Assessment & Plan Note (Signed)
Plan to recheck TSH in 1 to 2 months.  The last TSH was a little bit overly suppressed under 1.  But she does feel good on her regimen and she is not having symptoms of hyperthyroidism.  We will correct the prescription so that it is more accurate and how she is taking the medication she is okay on the 90 mcg tabs for right now.

## 2022-03-21 NOTE — Progress Notes (Signed)
    Virtual Visit via Video Note  I connected with Tina Pace on 03/21/22 at 10:50 AM EST by a video enabled telemedicine application and verified that I am speaking with the correct person using two identifiers.   I discussed the limitations of evaluation and management by telemedicine and the availability of in person appointments. The patient expressed understanding and agreed to proceed.  Patient location: at home Provider location: in office  Subjective:    CC:  No chief complaint on file.   HPI: Has some tendonitis in her right thumb.  Has been bothering for awhile and wanted some recommendations before going to ortho to check it out bothers her while on her phone and driving. Had a flare a couple of years ago.   Hypothyroidism - Taking medication regularly in the AM away from food and vitamins, etc. No recent change to skin, hair, or energy levels. She thinks may have missed a few tabs recently of the 90.  No palpitations.    Past medical history, Surgical history, Family history not pertinant except as noted below, Social history, Allergies, and medications have been entered into the medical record, reviewed, and corrections made.    Objective:    General: Speaking clearly in complete sentences without any shortness of breath.  Alert and oriented x3.  Normal judgment. No apparent acute distress.    Impression and Recommendations:    Problem List Items Addressed This Visit       Endocrine   Acquired hypothyroidism    Plan to recheck TSH in 1 to 2 months.  The last TSH was a little bit overly suppressed under 1.  But she does feel good on her regimen and she is not having symptoms of hyperthyroidism.  We will correct the prescription so that it is more accurate and how she is taking the medication she is okay on the 90 mcg tabs for right now.      Relevant Medications   thyroid (ARMOUR THYROID) 15 MG tablet   Other Relevant Orders   TSH + free T4   Other  Visit Diagnoses     Thumb pain, right    -  Primary      Right thumb pain - recommend NSAID, ice and thumb spica. Can get off of [employer]. If not better in 3 weeks can see ortho. Ice PRN.  Was like it is just from overuse.   Orders Placed This Encounter  Procedures   TSH + free T4    Meds ordered this encounter  Medications   thyroid (ARMOUR THYROID) 15 MG tablet    Sig: 2 tabs PO on Monday, Wednesday and Friday. And 1 tab daily the other 4 days of the week.    Dispense:  120 tablet    Refill:  1     I discussed the assessment and treatment plan with the patient. The patient was provided an opportunity to ask questions and all were answered. The patient agreed with the plan and demonstrated an understanding of the instructions.   The patient was advised to call back or seek an in-person evaluation if the symptoms worsen or if the condition fails to improve as anticipated.   Beatrice Lecher, MD

## 2022-03-21 NOTE — Progress Notes (Signed)
Pt reports that the Armour thyroid 15 mg QD was dropped off. She stated that she is taking the 90 mg daily, 15 mg daily  and the 15 mg 3 times a week.

## 2022-03-25 ENCOUNTER — Other Ambulatory Visit: Payer: Self-pay | Admitting: Obstetrics & Gynecology

## 2022-03-27 NOTE — Telephone Encounter (Signed)
Last AEX 05/23/2019--nothing scheduled. Last mammo 02/15/2021--nothing scheduled.  Will send pt mychart msg to make aware of it being time to schedule screenings.

## 2022-03-31 ENCOUNTER — Encounter: Payer: Self-pay | Admitting: Internal Medicine

## 2022-03-31 ENCOUNTER — Other Ambulatory Visit: Payer: Self-pay

## 2022-03-31 ENCOUNTER — Ambulatory Visit: Payer: BC Managed Care – PPO | Admitting: Internal Medicine

## 2022-03-31 VITALS — BP 132/82 | HR 94 | Temp 98.1°F | Resp 20 | Ht 64.0 in | Wt 136.9 lb

## 2022-03-31 DIAGNOSIS — H1045 Other chronic allergic conjunctivitis: Secondary | ICD-10-CM | POA: Diagnosis not present

## 2022-03-31 DIAGNOSIS — J3089 Other allergic rhinitis: Secondary | ICD-10-CM | POA: Diagnosis not present

## 2022-03-31 MED ORDER — IPRATROPIUM BROMIDE 0.06 % NA SOLN: 2.0000 | Freq: Four times a day (QID) | NASAL | 12 refills | Status: DC

## 2022-03-31 NOTE — Patient Instructions (Addendum)
Chronic  Rhinitis moderately well controlled : - allergy testing today was positive to beech tree pollen, but the positive control was inadequate.   -We will double check with blood work  - allergen avoidance as below - consider allergy shots as long term control of your symptoms by teaching your immune system to be more tolerant of your allergy triggers - Start Nasal Steroid Spray: Options include Flonase (fluticasone), Nasocort (triamcinolone), Nasonex (mometasome) 1- 2 sprays in each nostril daily (can buy over-the-counter if not covered by insurance)  Best results if used daily.  - Start Atrovent (Ipratropium Bromide) 1-2 sprays in each nostril up to 3 times a day as needed for runny nose/post nasal drip/drainage.  Use less frequently if airway gets too dry.  - Continue over the counter antihistamine daily or daily as needed.   -Your options include Zyrtec (Cetirizine) 10mg , Claritin (Loratadine) 10mg , Allegra (Fexofenadine) 180mg , or Xyzal (Levocetirinze) 5mg   Allergic Conjunctivitis:  - Consider Allergy Eye drops: great options include Pataday (Olopatadine) or Zaditor (ketotifen) for eye symptoms daily as needed-both sold over the counter if not covered by insurance.   -Avoid eye drops that say red eye relief as they may contain medications that dry out your eyes.  Follow up: We will contact you with blood results Otherwise follow-up in 6 months  Thank you so much for letting me partake in your care today.  Don't hesitate to reach out if you have any additional concerns!  , MD  [facility] [place] [place]

## 2022-03-31 NOTE — Progress Notes (Unsigned)
New Patient Note  RE: Tina PattenKimberly J Southwood MRN: [id number] DOB: 09/06/1960 Date of Office Visit: 03/31/2022  Consult requested by: Agapito GamesMetheney, Catherine D, * Primary care provider: Agapito GamesMetheney, Catherine D, MD  Chief Complaint: Nasal Congestion (Nasal congestion.)  History of Present Illness: I had the pleasure of seeing Lowry BowlKimberly Chasse for initial evaluation at the [facility] on 04/03/2022. She is a 61 y.o. female, who is referred here by Agapito GamesMetheney, Catherine D, MD for the evaluation of allergic rhinitis .  History obtained from patient, chart review.  Chronic rhinitis: started many years ago  Symptoms include:  itchy throat, ear fullness, nasal congestion, rhinorrhea, post nasal drainage, sneezing, watery eyes, itchy eyes, and itchy nose  recurrent sinus infections  Occurs seasonally-spring and fall  Potential triggers: pollen season,  Treatments tried: allegra, zyrtec  Previous allergy testing: no History of reflux/heartburn: no History of chronic sinusitis or sinus surgery: no Nonallergic triggers:  perfume      Assessment and Plan: Cala BradfordKimberly is a 61 y.o. female with: Other allergic rhinitis - Plan: Allergy Test, Allergens w/Total IgE Area 2  Other chronic allergic conjunctivitis of both eyes - Plan: Allergy Test, Allergens w/Total IgE Area 2 Plan: Patient Instructions  Chronic  Rhinitis moderately well controlled : - allergy testing today was positive to beech tree pollen, but the positive control was inadequate.   -We will double check with blood work  - allergen avoidance as below - consider allergy shots as long term control of your symptoms by teaching your immune system to be more tolerant of your allergy triggers - Start Nasal Steroid Spray: Options include Flonase (fluticasone), Nasocort (triamcinolone), Nasonex (mometasome) 1- 2 sprays in each nostril daily (can buy over-the-counter if not covered by insurance)  Best results if used daily.  - Start  Atrovent (Ipratropium Bromide) 1-2 sprays in each nostril up to 3 times a day as needed for runny nose/post nasal drip/drainage.  Use less frequently if airway gets too dry.  - Continue over the counter antihistamine daily or daily as needed.   -Your options include Zyrtec (Cetirizine) 10mg , Claritin (Loratadine) 10mg , Allegra (Fexofenadine) 180mg , or Xyzal (Levocetirinze) 5mg   Allergic Conjunctivitis:  - Consider Allergy Eye drops: great options include Pataday (Olopatadine) or Zaditor (ketotifen) for eye symptoms daily as needed-both sold over the counter if not covered by insurance.   -Avoid eye drops that say red eye relief as they may contain medications that dry out your eyes.  Follow up: We will contact you with blood results Otherwise follow-up in 6 months  Thank you so much for letting me partake in your care today.  Don't hesitate to reach out if you have any additional concerns!  Ferol LuzEvelyn Rosene Pilling, MD  [facility] [place], [place]   Meds ordered this encounter  Medications   ipratropium (ATROVENT) 0.06 % nasal spray    Sig: Place 2 sprays into both nostrils 4 (four) times daily.    Dispense:  15 mL    Refill:  12   Lab Orders         Allergens w/Total IgE Area 2      Other allergy screening: Asthma: no Rhino conjunctivitis: yes Food allergy: no Medication allergy: no Hymenoptera allergy: no Urticaria: no Eczema:no History of recurrent infections suggestive of immunodeficency: no  Diagnostics: Skin Testing: Environmental allergy panel and select foods. allergy testing today was positive to beech tree pollen, but the positive control was inadequate.  Results interpreted by myself and  discussed with patient/family.  Airborne Adult Perc - 03/31/22 1419     Allergen Manufacturer Waynette Buttery    Location Back    Number of Test 59    Panel 1 Select    1. Control-Buffer 50% Glycerol Negative    2. Control-Histamine 1 mg/ml 2+    3. Albumin saline Negative     4. Bahia Negative    5. French Southern Territories Negative    6. Johnson Negative    7. Kentucky Blue Negative    8. Meadow Fescue Negative    9. Perennial Rye Negative    10. Sweet Vernal Negative    11. Timothy Negative    12. Cocklebur Negative    13. Burweed Marshelder Negative    14. Ragweed, short Negative    15. Ragweed, Giant Negative    16. Plantain,  English Negative    17. Lamb's Quarters Negative    18. Sheep Sorrell Negative    19. Rough Pigweed Negative    20. Marsh Elder, Rough Negative    21. Mugwort, Common Negative    22. Ash mix Negative    23. Birch mix Negative    24. Beech American Negative    25. Box, Elder Negative    26. Cedar, red Negative    27. Cottonwood, Guinea-Bissau Negative    28. Elm mix Negative    29. Hickory Negative    30. Maple mix Negative    31. Oak, Guinea-Bissau mix Negative    32. Pecan Pollen Negative    33. Pine mix Negative    34. Sycamore Eastern Negative    35. Walnut, Black Pollen Negative    36. Alternaria alternata Negative    37. Cladosporium Herbarum Negative    38. Aspergillus mix Negative    39. Penicillium mix Negative    40. Bipolaris sorokiniana (Helminthosporium) Negative    41. Drechslera spicifera (Curvularia) Negative    42. Mucor plumbeus Negative    43. Fusarium moniliforme Negative    44. Aureobasidium pullulans (pullulara) Negative    45. Rhizopus oryzae Negative    46. Botrytis cinera Negative    47. Epicoccum nigrum Negative    48. Phoma betae Negative    49. Candida Albicans Negative    50. Trichophyton mentagrophytes Negative    51. Mite, D Farinae  5,000 AU/ml Negative    52. Mite, D Pteronyssinus  5,000 AU/ml Negative    53. Cat Hair 10,000 BAU/ml Negative    54.  Dog Epithelia Negative    55. Mixed Feathers Negative    56. Horse Epithelia Negative    57. Cockroach, German Negative    58. Mouse Negative    59. Tobacco Leaf Negative             Food Perc - 03/31/22 1420       Test Information   Allergen  Manufacturer Greer    Location Back    Number of allergen test 10    Food Select      Food   1. Peanut Negative    2. Soybean food Negative    3. Wheat, whole Negative    4. Sesame Negative    5. Milk, cow Negative    6. Egg White, chicken Negative    7. Casein Negative    8. Shellfish mix Negative    9. Fish mix Negative    10. Cashew Negative             Past Medical History: Patient Active  Problem List   Diagnosis Date Noted   Allergic rhinitis 12/20/2021   Personal history of colonic polyps 09/14/2021   Colon cancer screening 09/14/2021   IFG (impaired fasting glucose) 01/30/2019   Migraine without aura and without status migrainosus, not intractable 01/10/2019   Acquired hypothyroidism 12/27/2015   Rupture of ulnar collateral ligament of left thumb 04/27/2014   History reviewed. No pertinent past medical history. Past Surgical History: Past Surgical History:  Procedure Laterality Date   dental implant     Medication List:  Current Outpatient Medications  Medication Sig Dispense Refill   ARMOUR THYROID 90 MG tablet TAKE 1 TABLET BY MOUTH EVERY DAY 90 tablet 3   B Complex Vitamins (B COMPLEX PO) Take by mouth.     CALCIUM PO Take by mouth.     cholecalciferol (VITAMIN D) 1000 units tablet Take 1,000 Units by mouth daily.     fexofenadine (ALLEGRA) 180 MG tablet Take 180 mg by mouth daily.     fluticasone (FLONASE) 50 MCG/ACT nasal spray Place 2 sprays into both nostrils daily. 16 g 0   ipratropium (ATROVENT) 0.06 % nasal spray Place 2 sprays into both nostrils 4 (four) times daily. 15 mL 12   Omega-3 Fatty Acids (FISH OIL) 1000 MG CAPS Take by mouth.     thyroid (ARMOUR THYROID) 15 MG tablet 2 tabs PO on Monday, Wednesday and Friday. And 1 tab daily the other 4 days of the week. 120 tablet 1   VAGIFEM 10 MCG TABS vaginal tablet PLACE 1 TABLET VAGINALLY 2 TIMES A WEEK. 8 tablet 1   zolmitriptan (ZOMIG) 5 MG tablet TAKE 1 TABLET DAILY AS NEEDED MAY REPEAT DOSE IN  1 HOUR IF NO EFFECT 10 tablet 1   No current facility-administered medications for this visit.   Allergies: Allergies  Allergen Reactions   Bee Pollen Other (See Comments)    From the trees and plants makes her have runny nose and scracthy throat.   Social History: Social History   Socioeconomic History   Marital status: Significant Other    Spouse name: Not on file   Number of children: Not on file   Years of education: Not on file   Highest education level: Not on file  Occupational History   Occupation: teacher  Tobacco Use   Smoking status: Former    Types: Cigarettes    Quit date: 11/22/1997    Years since quitting: 24.3   Smokeless tobacco: Never  Vaping Use   Vaping Use: Never used  Substance and Sexual Activity   Alcohol use: Yes    Comment: OCC   Drug use: Never   Sexual activity: Yes    Partners: Female    Birth control/protection: Post-menopausal    Comment: 1st intercourse- 19, More than 5 partners  Other Topics Concern   Not on file  Social History Narrative   Not on file   Social Determinants of Health   Financial Resource Strain: Not on file  Food Insecurity: Not on file  Transportation Needs: Not on file  Physical Activity: Not on file  Stress: Not on file  Social Connections: Not on file   Lives in a condo that is 59 years old.  There are no roaches in the house but is 2 feet off the floor.  She does not have dust mite precautions on bed or pillows.  She is not exposed to fumes, chemicals or dyes.  There is no HEPA filter in the home and home [place]  not near an interstate [place] area.. Smoking: Prior smoker quit in 2015.  Previously 1/2 to 1 pack/day on and off for many years Occupation: Conservator, museum/gallery History: Immunologist in the house: no Engineer, civil (consulting) in the family room: no Carpet in the bedroom: no Heating: electric Cooling: central Pet: yes cats with access to bedroom, dogs outside the home and  Family History: Family  History  Problem Relation Age of Onset   Diabetes Mother    Allergic rhinitis Father    Cancer Father        bladder/prostate   Esophageal cancer Father      ROS: All others negative except as noted per HPI.   Objective: BP 132/82   Pulse 94   Temp 98.1 F (36.7 C)   Resp 20   Ht 5\' 4"  (1.626 m)   Wt 136 lb 14.4 oz (62.1 kg)   SpO2 99%   BMI 23.50 kg/m  Body mass index is 23.5 kg/m.  General Appearance:  Alert, cooperative, no distress, appears stated age  Head:  Normocephalic, without obvious abnormality, atraumatic  Eyes:  Conjunctiva clear, EOM's intact  Nose: Nares normal,  erythematous nasal mucosa, no visible anterior polyps, and septum midline  Throat: Lips, tongue normal; teeth and gums normal, no tonsillar exudate and + cobblestoning  Neck: Supple, symmetrical  Lungs:   clear to auscultation bilaterally, Respirations unlabored, no coughing  Heart:  regular rate and rhythm and no murmur, Appears well perfused  Extremities: No edema  Skin: Skin color, texture, turgor normal, no rashes or lesions on visualized portions of skin  Neurologic: No gross deficits   The plan was reviewed with the patient/family, and all questions/concerned were addressed.  It was my pleasure to see Kem today and participate in her care. Please feel free to contact me with any questions or concerns.  Sincerely,  Cala Bradford, MD Allergy & Immunology  [facility] office: [phone] [facility] office: [phone]

## 2022-04-03 LAB — ALLERGENS W/TOTAL IGE AREA 2

## 2022-04-04 ENCOUNTER — Ambulatory Visit
Admission: RE | Admit: 2022-04-04 | Discharge: 2022-04-04 | Disposition: A | Discharge status: Home / Self Care | Payer: BC Managed Care – PPO | Source: Ambulatory Visit | Attending: Urgent Care | Admitting: Urgent Care

## 2022-04-04 ENCOUNTER — Other Ambulatory Visit: Payer: Self-pay

## 2022-04-04 VITALS — BP 113/75 | HR 83 | Temp 98.8°F | Resp 16

## 2022-04-04 DIAGNOSIS — J069 Acute upper respiratory infection, unspecified: Secondary | ICD-10-CM

## 2022-04-04 DIAGNOSIS — J309 Allergic rhinitis, unspecified: Secondary | ICD-10-CM | POA: Diagnosis not present

## 2022-04-04 MED ORDER — PREDNISONE 20 MG PO TABS: ORAL_TABLET | ORAL | 0 refills | Status: DC

## 2022-04-04 NOTE — Telephone Encounter (Signed)
Pt read mychart msg on 03/27/2022  03/27/2022: "Angelyn Punt D, CMA Left message to call and schedule"  03/30/22: "Jennye Moccasin, CMA Two messages left for patient to call and schedule; patient has not returned our call."   Please advise.

## 2022-04-04 NOTE — Discharge Instructions (Addendum)
We will manage this as a viral illness. For sore throat or cough try using a honey-based tea. Use 3 teaspoons of honey with juice squeezed from half lemon. Place shaved pieces of ginger into 1/2-1 cup of water and warm over stove top. Then mix the ingredients and repeat every 4 hours as needed. Please take ibuprofen 600mg  every 6 hours with food alternating with OR taken together with Tylenol 500mg -650mg  every 6 hours for throat pain, fevers, aches and pains. Hydrate very well with at least 2 liters of water. Eat light meals such as soups (chicken and noodles, vegetable, chicken and wild rice).  Do not eat foods that you are allergic to.  Taking an antihistamine like Allegra can help against postnasal drainage, sinus congestion which can cause sinus pain, sinus headaches, throat pain, painful swallowing, coughing.  You can take this together with pseudoephedrine (Sudafed) at a dose of 30-60 mg 3 times a day or twice daily as needed for the same kind of nasal drip, congestion.  If you end up starting the prednisone then do not take pseudoephedrine or NSAIDs. Otherwise you can taken everything else.

## 2022-04-04 NOTE — ED Triage Notes (Signed)
Patient reports left pain, fatigue, sore throat with onset Saturday. Denies cough, fever, and chills. States she has taken meloxicam this AM and allegra last night.

## 2022-04-04 NOTE — ED Provider Notes (Signed)
[facility]  Note:  This document was prepared using Conservation officer, historic buildings and may include unintentional dictation errors.  MRN: [id number] DOB: 03-09-1961  Subjective:   Tina Pace is a 61 y.o. female presenting for 5-day history of acute onset persistent throat pain, left ear pain, fatigue, malaise.  No fever, coughing, chills, chest pain, shortness of breath or wheezing.  Has a history of allergic rhinitis and is on Allegra consistently for this.  She is also use meloxicam for pain.  No current facility-administered medications for this encounter.  Current Outpatient Medications:    ARMOUR THYROID 90 MG tablet, TAKE 1 TABLET BY MOUTH EVERY DAY, Disp: 90 tablet, Rfl: 3   B Complex Vitamins (B COMPLEX PO), Take by mouth., Disp: , Rfl:    CALCIUM PO, Take by mouth., Disp: , Rfl:    cholecalciferol (VITAMIN D) 1000 units tablet, Take 1,000 Units by mouth daily., Disp: , Rfl:    fexofenadine (ALLEGRA) 180 MG tablet, Take 180 mg by mouth daily., Disp: , Rfl:    fluticasone (FLONASE) 50 MCG/ACT nasal spray, Place 2 sprays into both nostrils daily., Disp: 16 g, Rfl: 0   ipratropium (ATROVENT) 0.06 % nasal spray, Place 2 sprays into both nostrils 4 (four) times daily., Disp: 15 mL, Rfl: 12   Omega-3 Fatty Acids (FISH OIL) 1000 MG CAPS, Take by mouth., Disp: , Rfl:    thyroid (ARMOUR THYROID) 15 MG tablet, 2 tabs PO on Monday, Wednesday and Friday. And 1 tab daily the other 4 days of the week., Disp: 120 tablet, Rfl: 1   VAGIFEM 10 MCG TABS vaginal tablet, PLACE 1 TABLET VAGINALLY 2 TIMES A WEEK., Disp: 8 tablet, Rfl: 1   zolmitriptan (ZOMIG) 5 MG tablet, TAKE 1 TABLET DAILY AS NEEDED MAY REPEAT DOSE IN 1 HOUR IF NO EFFECT, Disp: 10 tablet, Rfl: 1   Allergies  Allergen Reactions   Bee Pollen Other (See Comments)    From the trees and plants makes her have runny nose and scracthy throat.    History reviewed. No pertinent past medical history.   Past  Surgical History:  Procedure Laterality Date   dental implant      Family History  Problem Relation Age of Onset   Diabetes Mother    Allergic rhinitis Father    Cancer Father        bladder/prostate   Esophageal cancer Father     Social History   Tobacco Use   Smoking status: Former    Types: Cigarettes    Quit date: 11/22/1997    Years since quitting: 24.3   Smokeless tobacco: Never  Vaping Use   Vaping Use: Never used  Substance Use Topics   Alcohol use: Yes    Comment: OCC   Drug use: Never    ROS   Objective:   Vitals: BP 113/75 (BP Location: Right Arm)   Pulse 83   Temp 98.8 F (37.1 C) (Oral)   Resp 16   SpO2 98%   Physical Exam Constitutional:      General: She is not in acute distress.    Appearance: Normal appearance. She is well-developed and normal weight. She is not ill-appearing, toxic-appearing or diaphoretic.  HENT:     Head: Normocephalic and atraumatic.     Right Ear: Tympanic membrane, ear canal and external ear normal. No drainage or tenderness. No middle ear effusion. There is no impacted cerumen. Tympanic membrane is not erythematous or bulging.  Left Ear: Tympanic membrane, ear canal and external ear normal. No drainage or tenderness.  No middle ear effusion. There is no impacted cerumen. Tympanic membrane is not erythematous or bulging.     Nose: Nose normal. No congestion or rhinorrhea.     Mouth/Throat:     Mouth: Mucous membranes are moist. No oral lesions.     Pharynx: Posterior oropharyngeal erythema present. No pharyngeal swelling, oropharyngeal exudate or uvula swelling.     Tonsils: No tonsillar exudate or tonsillar abscesses.  Eyes:     General: No scleral icterus.       Right eye: No discharge.        Left eye: No discharge.     Extraocular Movements: Extraocular movements intact.     Right eye: Normal extraocular motion.     Left eye: Normal extraocular motion.     Conjunctiva/sclera: Conjunctivae normal.   Cardiovascular:     Rate and Rhythm: Normal rate.  Pulmonary:     Effort: Pulmonary effort is normal.  Musculoskeletal:     Cervical back: Normal range of motion and neck supple.  Lymphadenopathy:     Cervical: No cervical adenopathy.  Skin:    General: Skin is warm and dry.  Neurological:     General: No focal deficit present.     Mental Status: She is alert and oriented to person, place, and time.  Psychiatric:        Mood and Affect: Mood normal.        Behavior: Behavior normal.     Assessment and Plan :   PDMP not reviewed this encounter.  1. Viral upper respiratory illness   2. Allergic rhinitis, unspecified seasonality, unspecified trigger     Does not meet [facility] criteria for strep testing.  In the context of her allergic rhinitis, offered an oral prednisone course.  Discussed antibiotic stewardship.  We will hold off as there is no signs of overt bacterial infection. Suspect viral URI, viral syndrome. Physical exam findings reassuring and vital signs stable for discharge.  Otherwise advised supportive care, offered symptomatic relief. Counseled patient on potential for adverse effects with medications prescribed/recommended today, ER and return-to-clinic precautions discussed, patient verbalized understanding.     Wallis Bamberg, [place] 04/04/22 [id number]

## 2022-04-05 ENCOUNTER — Ambulatory Visit
Admission: RE | Admit: 2022-04-05 | Discharge: 2022-04-05 | Disposition: A | Discharge status: Home / Self Care | Payer: BC Managed Care – PPO | Source: Ambulatory Visit | Attending: Emergency Medicine | Admitting: Emergency Medicine

## 2022-04-05 VITALS — BP 115/78 | HR 95 | Temp 98.9°F | Resp 16

## 2022-04-05 DIAGNOSIS — J029 Acute pharyngitis, unspecified: Secondary | ICD-10-CM | POA: Diagnosis not present

## 2022-04-05 DIAGNOSIS — J038 Acute tonsillitis due to other specified organisms: Secondary | ICD-10-CM | POA: Insufficient documentation

## 2022-04-05 DIAGNOSIS — B9689 Other specified bacterial agents as the cause of diseases classified elsewhere: Secondary | ICD-10-CM

## 2022-04-05 LAB — POCT RAPID STREP A (OFFICE): Rapid Strep A Screen: NEGATIVE

## 2022-04-05 MED ORDER — CEFDINIR 300 MG PO CAPS: 300.0000 mg | ORAL_CAPSULE | Freq: Two times a day (BID) | ORAL | 0 refills | Status: AC

## 2022-04-05 MED ORDER — FLUCONAZOLE 150 MG PO TABS: ORAL_TABLET | ORAL | 0 refills | Status: DC

## 2022-04-05 NOTE — Discharge Instructions (Addendum)
Your strep test today is negative.  Streptococcal throat culture will be performed per our protocol.  The result of your throat culture will be posted to your MyChart once it is complete, this typically takes 3 to 5 days.    Based on the appearance of your left tonsil at this time, which is significantly red, swollen and now has white patches on it, I recommend that you begin antibiotics for empiric treatment of presumed bacterial infection in your tonsils.  Because your left tonsil is greater in size than your right, I am concerned that you may have an abscess in your left tonsil as well.  I have sent a prescription for cefdinir to your pharmacy that you will take twice daily for the next 10 days.  This antibiotic is highly effective at treating bacteria that causes infections in upper respiratory tracts.  Please discard your toothbrush and begin using a new one after you have been on cefdinir for 24 hours.  I also sent a prescription for Diflucan to your pharmacy to take in case she develop a vaginal yeast infection after taking 10 days of cefdinir.  Please take 1 tablet at onset of symptoms and take the second tablet 3 days later.  Please also continue prednisone as prescribed for continued relief of swelling and discomfort in your throat.  Please follow-up if you do not have meaningful improvement of your symptoms in the next 7-10 days.

## 2022-04-05 NOTE — ED Provider Notes (Signed)
[facility]    CSN: [id number] Arrival date & time: 04/05/22  1757    HISTORY   Chief Complaint  Patient presents with   Follow-up   Sore Throat   HPI Tina Pace is a pleasant, 61 y.o. female who presents to urgent care today. Patient was seen yesterday for chief complaint of sore throat.  Patient had normal vital signs on arrival.  It was felt that she did not meet her criteria for rapid strep testing so this was not performed.  Patient was provided with a prescription for prednisone which she states she took today and had relief of swelling in her throat.  Patient states that last night she noticed that she had red spots and white patches on the back of her throat so she returned today for testing for strep pharyngitis.  Rapid strep test today is negative.  Patient's vital signs are normal on arrival today.  The history is provided by the patient.   History reviewed. No pertinent past medical history. Patient Active Problem List   Diagnosis Date Noted   Allergic rhinitis 12/20/2021   Personal history of colonic polyps 09/14/2021   Colon cancer screening 09/14/2021   IFG (impaired fasting glucose) 01/30/2019   Migraine without aura and without status migrainosus, not intractable 01/10/2019   Acquired hypothyroidism 12/27/2015   Rupture of ulnar collateral ligament of left thumb 04/27/2014   Past Surgical History:  Procedure Laterality Date   dental implant     OB History     Gravida  0   Para  0   Term  0   Preterm  0   AB  0   Living  0      SAB  0   IAB  0   Ectopic  0   Multiple  0   Live Births  0          Home Medications    Prior to Admission medications   Medication Sig Start Date End Date Taking? Authorizing Provider  predniSONE (DELTASONE) 20 MG tablet Take 2 tablets daily with breakfast. 04/04/22  Yes Wallis Bamberg, PA-C  ARMOUR THYROID 90 MG tablet TAKE 1 TABLET BY MOUTH EVERY DAY 06/17/21   Agapito Games, MD   B Complex Vitamins (B COMPLEX PO) Take by mouth.    [provider]  CALCIUM PO Take by mouth.    [provider]  cholecalciferol (VITAMIN D) 1000 units tablet Take 1,000 Units by mouth daily.    [provider]  fexofenadine (ALLEGRA) 180 MG tablet Take 180 mg by mouth daily.    [provider]  fluticasone (FLONASE) 50 MCG/ACT nasal spray Place 2 sprays into both nostrils daily. 01/31/22   Freddy Finner, NP  ipratropium (ATROVENT) 0.06 % nasal spray Place 2 sprays into both nostrils 4 (four) times daily. 03/31/22   Ferol Luz, MD  Omega-3 Fatty Acids (FISH OIL) 1000 MG CAPS Take by mouth.    [provider]  thyroid [facility] THYROID) 15 MG tablet 2 tabs PO on Monday, Wednesday and Friday. And 1 tab daily the other 4 days of the week. 03/21/22   Agapito Games, MD  VAGIFEM 10 MCG TABS vaginal tablet PLACE 1 TABLET VAGINALLY 2 TIMES A WEEK. 02/27/22   [name], MD  zolmitriptan (ZOMIG) 5 MG tablet TAKE 1 TABLET DAILY AS NEEDED MAY REPEAT DOSE IN 1 HOUR IF NO EFFECT 07/28/21   Agapito Games, MD  Family History Family History  Problem Relation Age of Onset   Diabetes Mother    Allergic rhinitis Father    Cancer Father        bladder/prostate   Esophageal cancer Father    Social History Social History   Tobacco Use   Smoking status: Former    Types: Cigarettes    Quit date: 11/22/1997    Years since quitting: 24.3   Smokeless tobacco: Never  Vaping Use   Vaping Use: Never used  Substance Use Topics   Alcohol use: Yes    Comment: OCC   Drug use: Never   Allergies   Bee pollen  Review of Systems Review of Systems Pertinent findings revealed after performing a 14 point review of systems has been noted in the history of present illness.  Physical Exam Triage Vital Signs ED Triage Vitals  Enc Vitals Group     BP 03/11/21 0827 (!) 147/82     Pulse Rate 03/11/21 0827 72     Resp 03/11/21 0827 18      Temp 03/11/21 0827 98.3 F (36.8 C)     Temp Source 03/11/21 0827 Oral     SpO2 03/11/21 0827 98 %     Weight --      Height --      Head Circumference --      Peak Flow --      Pain Score 03/11/21 0826 5     Pain Loc --      Pain Edu? --      Excl. in GC? --   No data found.  Updated Vital Signs BP 115/78 (BP Location: Right Arm)   Pulse 95   Temp 98.9 F (37.2 C) (Oral)   Resp 16   SpO2 98%   Physical Exam Constitutional:      General: She is not in acute distress.    Appearance: Normal appearance. She is well-developed and normal weight. She is not ill-appearing, toxic-appearing or diaphoretic.  HENT:     Head: Normocephalic and atraumatic.     Right Ear: Hearing, ear canal and external ear normal. No drainage or tenderness. No middle ear effusion. There is no impacted cerumen. Tympanic membrane is bulging. Tympanic membrane is not erythematous.     Left Ear: Hearing, ear canal and external ear normal. No drainage or tenderness.  No middle ear effusion. There is no impacted cerumen. Tympanic membrane is bulging. Tympanic membrane is not erythematous.     Ears:     Comments: Bilateral TMs bulging with clear fluid    Nose: Nose normal. No congestion or rhinorrhea.     Mouth/Throat:     Lips: Pink.     Mouth: Mucous membranes are moist. No oral lesions.     Tongue: No lesions. Tongue does not deviate from midline.     Palate: No mass and lesions.     Pharynx: Uvula midline. Pharyngeal swelling and posterior oropharyngeal erythema present. No oropharyngeal exudate or uvula swelling.     Tonsils: Tonsillar exudate present. No tonsillar abscesses. 3+ on the right. 4+ on the left.  Eyes:     General: No scleral icterus.       Right eye: No discharge.        Left eye: No discharge.     Extraocular Movements: Extraocular movements intact.     Right eye: Normal extraocular motion.     Left eye: Normal extraocular motion.     Conjunctiva/sclera: Conjunctivae normal.  Cardiovascular:     Rate and Rhythm: Normal rate.  Pulmonary:     Effort: Pulmonary effort is normal.  Musculoskeletal:     Cervical back: Full passive range of motion without pain, normal range of motion and neck supple.  Lymphadenopathy:     Cervical: Cervical adenopathy present.     Right cervical: Superficial cervical adenopathy and posterior cervical adenopathy present.     Left cervical: Superficial cervical adenopathy and posterior cervical adenopathy present.  Skin:    General: Skin is warm and dry.  Neurological:     General: No focal deficit present.     Mental Status: She is alert and oriented to person, place, and time.  Psychiatric:        Mood and Affect: Mood normal.        Behavior: Behavior normal.     Visual Acuity Right Eye Distance:   Left Eye Distance:   Bilateral Distance:    Right Eye Near:   Left Eye Near:    Bilateral Near:     UC Couse / Diagnostics / Procedures:     Radiology No results found.  Procedures Procedures (including critical care time) EKG  Pending results:  Labs Reviewed  CULTURE, GROUP A STREP [facility]  POCT RAPID STREP A (OFFICE)    Medications Ordered in UC: Medications - No data to display  UC Diagnoses / Final Clinical Impressions(s)   I have reviewed the triage vital signs and the nursing notes.  Pertinent labs & imaging results that were available during my care of the patient were reviewed by me and considered in my medical decision making (see chart for details).    Final diagnoses:  Sore throat  Acute pharyngitis, unspecified etiology  Acute bacterial tonsillitis   Patient with significant swelling of her left tonsil, much greater than her right which is also swollen.  Per patient, the swelling has actually improved since she took her first dose of prednisone this morning.  I do not see any documentation that patient had tonsil swelling yesterday.  Rapid strep test today is negative but based on physical  exam findings, I am concerned that she has an abscess in her left tonsil and requires antibiotics at this time.  Prescription for cefdinir was sent to her pharmacy.  Patient requested a prescription for Diflucan for possible antibiotic induced vaginal yeast infection.  Patient was advised to throw away her toothbrush after 24 hours of antibiotics.  Return precautions advised.  ED Prescriptions     Medication Sig Dispense Auth. Provider   cefdinir (OMNICEF) 300 MG capsule Take 1 capsule (300 mg total) by mouth 2 (two) times daily for 10 days. 20 capsule Theadora RamaMorgan, [place] Deleo Scales, PA-C   fluconazole (DIFLUCAN) 150 MG tablet Take 1 tablet on day 4 of antibiotics.  Take second tablet 3 days later. 2 tablet Theadora RamaMorgan, Deavin Forst Scales, PA-C      PDMP not reviewed this encounter.  Disposition Upon Discharge:  Condition: stable for discharge home Home: take medications as prescribed; routine discharge instructions as discussed; follow up as advised.  Patient presented with an acute illness with associated systemic symptoms and significant discomfort requiring urgent management. In my opinion, this is a condition that a prudent lay person (someone who possesses an average knowledge of health and medicine) may potentially expect to result in complications if not addressed urgently such as respiratory distress, impairment of bodily function or dysfunction of bodily organs.   Routine symptom specific, illness specific and/or disease specific instructions  were discussed with the patient and/or caregiver at length.   As such, the patient has been evaluated and assessed, work-up was performed and treatment was provided in alignment with urgent care protocols and evidence based medicine.  Patient/parent/caregiver has been advised that the patient may require follow up for further testing and treatment if the symptoms continue in spite of treatment, as clinically indicated and appropriate.  If the patient was tested  for COVID-19, Influenza and/or RSV, then the patient/parent/guardian was advised to isolate at home pending the results of his/her diagnostic coronavirus test and potentially longer if they're positive. I have also advised pt that if his/her COVID-19 test returns positive, it's recommended to self-isolate for at least 10 days after symptoms first appeared AND until fever-free for 24 hours without fever reducer AND other symptoms have improved or resolved. Discussed self-isolation recommendations as well as instructions for household member/close contacts as per the [facility] and [place] DHHS, and also gave patient the COVID packet with this information.  Patient/parent/caregiver has been advised to return to the [facility] or PCP in 3-5 days if no better; to PCP or the Emergency Department if new signs and symptoms develop, or if the current signs or symptoms continue to change or worsen for further workup, evaluation and treatment as clinically indicated and appropriate  The patient will follow up with their current PCP if and as advised. If the patient does not currently have a PCP we will assist them in obtaining one.   The patient may need specialty follow up if the symptoms continue, in spite of conservative treatment and management, for further workup, evaluation, consultation and treatment as clinically indicated and appropriate.  Patient/parent/caregiver verbalized understanding and agreement of plan as discussed.  All questions were addressed during visit.  Please see discharge instructions below for further details of plan.  Discharge Instructions:   Discharge Instructions      Your strep test today is negative.  Streptococcal throat culture will be performed per our protocol.  The result of your throat culture will be posted to your MyChart once it is complete, this typically takes 3 to 5 days.    Based on the appearance of your left tonsil at this time, which is significantly red, swollen and now has  white patches on it, I recommend that you begin antibiotics for empiric treatment of presumed bacterial infection in your tonsils.  Because your left tonsil is greater in size than your right, I am concerned that you may have an abscess in your left tonsil as well.  I have sent a prescription for cefdinir to your pharmacy that you will take twice daily for the next 10 days.  This antibiotic is highly effective at treating bacteria that causes infections in upper respiratory tracts.  Please discard your toothbrush and begin using a new one after you have been on cefdinir for 24 hours.  I also sent a prescription for Diflucan to your pharmacy to take in case she develop a vaginal yeast infection after taking 10 days of cefdinir.  Please take 1 tablet at onset of symptoms and take the second tablet 3 days later.  Please also continue prednisone as prescribed for continued relief of swelling and discomfort in your throat.  Please follow-up if you do not have meaningful improvement of your symptoms in the next 7-10 days.       This office note has been dictated using Teaching laboratory technician.  Unfortunately, this method of dictation can sometimes lead to  typographical or grammatical errors.  I apologize for your inconvenience in advance if this occurs.  Please do not hesitate to reach out to me if clarification is needed.      Theadora Rama Scales, PA-C 04/08/22 1443

## 2022-04-05 NOTE — ED Triage Notes (Signed)
Reports sore throat and spots on the back of throat. Last taken prednisone prescribed today. Pt stated it help with swelling. Was seen yesterday at [facility]. Last dose of tylenol was 1000.

## 2022-04-10 LAB — CULTURE, GROUP A STREP (THRC)

## 2022-04-17 ENCOUNTER — Other Ambulatory Visit: Payer: Self-pay | Admitting: Family Medicine

## 2022-04-17 DIAGNOSIS — E039 Hypothyroidism, unspecified: Secondary | ICD-10-CM | POA: Diagnosis not present

## 2022-04-17 DIAGNOSIS — Z1231 Encounter for screening mammogram for malignant neoplasm of breast: Secondary | ICD-10-CM

## 2022-04-18 LAB — TSH+FREE T4: TSH W/REFLEX TO FT4: 0.94 mIU/L (ref 0.40–4.50)

## 2022-04-18 NOTE — Progress Notes (Signed)
Your lab work is within acceptable range and there are no concerning findings.   ?

## 2022-04-19 ENCOUNTER — Other Ambulatory Visit: Payer: Self-pay | Admitting: Family Medicine

## 2022-04-28 ENCOUNTER — Other Ambulatory Visit: Payer: Self-pay | Admitting: Obstetrics & Gynecology

## 2022-04-28 NOTE — Telephone Encounter (Signed)
Last annual exam 05/2019

## 2022-05-25 ENCOUNTER — Other Ambulatory Visit: Payer: Self-pay | Admitting: Obstetrics & Gynecology

## 2022-05-25 NOTE — Telephone Encounter (Signed)
Medication refill request: Yuvafem  Last AEX:  05-23-19 ML Next AEX: 07-21-22 Last MMG (if hormonal medication request): 02-15-21 density C/BIRADS 1 negative, scheduled 06-13-22 Refill authorized: Today, please advise.  Medication pended for #8, 1RF. Please refill if appropriate.

## 2022-06-11 ENCOUNTER — Other Ambulatory Visit: Payer: Self-pay | Admitting: Family Medicine

## 2022-06-11 DIAGNOSIS — E039 Hypothyroidism, unspecified: Secondary | ICD-10-CM

## 2022-06-13 ENCOUNTER — Ambulatory Visit
Admission: RE | Admit: 2022-06-13 | Discharge: 2022-06-13 | Disposition: A | Discharge status: Home / Self Care | Payer: BC Managed Care – PPO | Source: Ambulatory Visit | Attending: Family Medicine | Admitting: Family Medicine

## 2022-06-13 DIAGNOSIS — Z1231 Encounter for screening mammogram for malignant neoplasm of breast: Secondary | ICD-10-CM

## 2022-06-15 NOTE — Progress Notes (Signed)
Please call patient. Normal mammogram.  Repeat in 1 year.  

## 2022-06-22 ENCOUNTER — Other Ambulatory Visit: Payer: Self-pay | Admitting: Obstetrics & Gynecology

## 2022-07-21 ENCOUNTER — Encounter: Payer: Self-pay | Admitting: Obstetrics & Gynecology

## 2022-07-21 ENCOUNTER — Other Ambulatory Visit (HOSPITAL_COMMUNITY)
Admission: RE | Admit: 2022-07-21 | Discharge: 2022-07-21 | Disposition: A | Discharge status: Home / Self Care | Payer: BC Managed Care – PPO | Source: Ambulatory Visit | Attending: Obstetrics & Gynecology | Admitting: Obstetrics & Gynecology

## 2022-07-21 ENCOUNTER — Ambulatory Visit (INDEPENDENT_AMBULATORY_CARE_PROVIDER_SITE_OTHER): Payer: BC Managed Care – PPO | Admitting: Obstetrics & Gynecology

## 2022-07-21 VITALS — BP 110/72 | HR 82 | Ht 64.25 in | Wt 142.0 lb

## 2022-07-21 DIAGNOSIS — Z01419 Encounter for gynecological examination (general) (routine) without abnormal findings: Secondary | ICD-10-CM | POA: Insufficient documentation

## 2022-07-21 DIAGNOSIS — N952 Postmenopausal atrophic vaginitis: Secondary | ICD-10-CM

## 2022-07-21 MED ORDER — YUVAFEM 10 MCG VA TABS: ORAL_TABLET | VAGINAL | 4 refills | Status: DC

## 2022-07-21 NOTE — Progress Notes (Signed)
Tina Pace 12-08-1960 DK:[id number]   History:    62 y.o. G0 Same sex partner   RP:  Established patient presenting for annual gyn exam    HPI: Postmenopause, well on no systemic HRT x 3 years.  No PMB.  No pelvic pain.  Using Vagifem twice a week. Pap Neg 08/2020.  Pap reflex today.  Urine/BMs wnl.  Breasts wnl.  Screening Mammo Neg 05/2022.  BMI 24.18.  Good fitness/healthy nutrition. Colono 10/2021.  Fasting health labs with Fam MD.    Past medical history,surgical history, family history and social history were all reviewed and documented in the EPIC chart.  Gynecologic History No LMP recorded. Patient is postmenopausal.  Obstetric History OB History  Gravida Para Term Preterm AB Living  0 0 0 0 0 0  SAB IAB Ectopic Multiple Live Births  0 0 0 0 0     ROS: A ROS was performed and pertinent positives and negatives are included in the history. GENERAL: No fevers or chills. HEENT: No change in vision, no earache, sore throat or sinus congestion. NECK: No pain or stiffness. CARDIOVASCULAR: No chest pain or pressure. No palpitations. PULMONARY: No shortness of breath, cough or wheeze. GASTROINTESTINAL: No abdominal pain, nausea, vomiting or diarrhea, melena or bright red blood per rectum. GENITOURINARY: No urinary frequency, urgency, hesitancy or dysuria. MUSCULOSKELETAL: No joint or muscle pain, no back pain, no recent trauma. DERMATOLOGIC: No rash, no itching, no lesions. ENDOCRINE: No polyuria, polydipsia, no heat or cold intolerance. No recent change in weight. HEMATOLOGICAL: No anemia or easy bruising or bleeding. NEUROLOGIC: No headache, seizures, numbness, tingling or weakness. PSYCHIATRIC: No depression, no loss of interest in normal activity or change in sleep pattern.     Exam:   BP 110/72   Pulse 82   Ht 5' 4.25" (1.632 m)   Wt 142 lb (64.4 kg)   SpO2 99%   BMI 24.18 kg/m   Body mass index is 24.18 kg/m.  General appearance : Well developed well nourished female.  No acute distress HEENT: Eyes: no retinal hemorrhage or exudates,  Neck supple, trachea midline, no carotid bruits, no thyroidmegaly Lungs: Clear to auscultation, no rhonchi or wheezes, or rib retractions  Heart: Regular rate and rhythm, no murmurs or gallops Breast:Examined in sitting and supine position were symmetrical in appearance, no palpable masses or tenderness,  no skin retraction, no nipple inversion, no nipple discharge, no skin discoloration, no axillary or supraclavicular lymphadenopathy Abdomen: no palpable masses or tenderness, no rebound or guarding Extremities: no edema or skin discoloration or tenderness  Pelvic: Vulva: Normal             Vagina: No gross lesions or discharge  Cervix: No gross lesions or discharge.  Pap reflex done.  Uterus  AV, normal size, shape and consistency, non-tender and mobile  Adnexa  Without masses or tenderness  Anus: Normal   Assessment/Plan:  62 y.o. female for annual exam   1. Encounter for routine gynecological examination with Papanicolaou smear of cervix Postmenopause, well on no systemic HRT x 3 years.  No PMB.  No pelvic pain.  Using Vagifem twice a week. Pap Neg 08/2020.  Pap reflex today.  Urine/BMs wnl.  Breasts wnl.  Screening Mammo Neg 05/2022.  BMI 24.18.  Good fitness/healthy nutrition. Colono 10/2021.  Fasting health labs with Fam MD.   - Cytology - PAP( [place])  2. Postmenopausal atrophic vaginitis  Postmenopause, well on no systemic HRT x 3 years.  No  PMB.  No pelvic pain.  Using Vagifem twice a week. - YUVAFEM 10 MCG TABS vaginal tablet; PLACE 1 TABLET VAGINALLY 2 TIMES A WEEK.   Princess Bruins MD, 12:07 PM

## 2022-07-25 LAB — CYTOLOGY - PAP: Diagnosis: NEGATIVE

## 2022-08-02 ENCOUNTER — Encounter: Payer: Self-pay | Admitting: Family Medicine

## 2022-08-02 DIAGNOSIS — L659 Nonscarring hair loss, unspecified: Secondary | ICD-10-CM

## 2022-08-02 DIAGNOSIS — E039 Hypothyroidism, unspecified: Secondary | ICD-10-CM

## 2022-08-03 DIAGNOSIS — M1811 Unilateral primary osteoarthritis of first carpometacarpal joint, right hand: Secondary | ICD-10-CM | POA: Diagnosis not present

## 2022-08-04 NOTE — Telephone Encounter (Signed)
Labs ordered, please fax to [facility] in [place].

## 2022-08-08 DIAGNOSIS — L659 Nonscarring hair loss, unspecified: Secondary | ICD-10-CM | POA: Diagnosis not present

## 2022-08-08 DIAGNOSIS — E039 Hypothyroidism, unspecified: Secondary | ICD-10-CM | POA: Diagnosis not present

## 2022-08-08 DIAGNOSIS — E538 Deficiency of other specified B group vitamins: Secondary | ICD-10-CM | POA: Diagnosis not present

## 2022-08-08 LAB — VITAMIN B12: Vitamin B-12: 412 pg/mL (ref 200–1100)

## 2022-08-08 LAB — TSH+FREE T4: TSH W/REFLEX TO FT4: 0.82 mIU/L (ref 0.40–4.50)

## 2022-08-09 ENCOUNTER — Encounter: Payer: Self-pay | Admitting: Family Medicine

## 2022-08-09 NOTE — Progress Notes (Signed)
I think that sounds absolutely perfect.  Just a small change that should be effective.  We can definitely check again in 3 months.  Have a great week!

## 2022-08-09 NOTE — Progress Notes (Signed)
HI Tina Pace your TSH is a little lower than goal. Usually want it between 1-2, so that would mean we would decrease you med dose just slightly. Do you feel comfortable with that or would you rather just check again in 3 mo?  B12 is normal.

## 2022-08-19 IMAGING — MG MM DIGITAL SCREENING BILAT W/ TOMO AND CAD
8 series · 8 of 24 positions shown · non-contrast
Comparison: Previous exam(s).

CLINICAL DATA: Screening.

EXAM:
DIGITAL SCREENING BILATERAL MAMMOGRAM WITH TOMOSYNTHESIS AND CAD
TECHNIQUE: Bilateral screening digital craniocaudal and mediolateral oblique
mammograms were obtained. Bilateral screening digital breast
tomosynthesis was performed. The images were evaluated with
computer-aided detection.

[L MLO synth-2D]
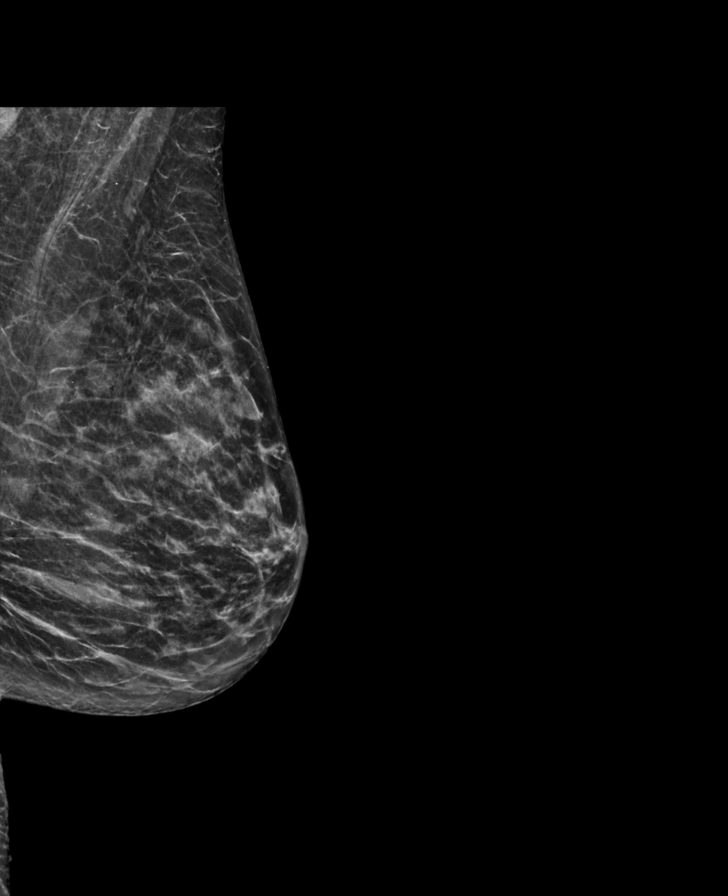

[R MLO synth-2D]
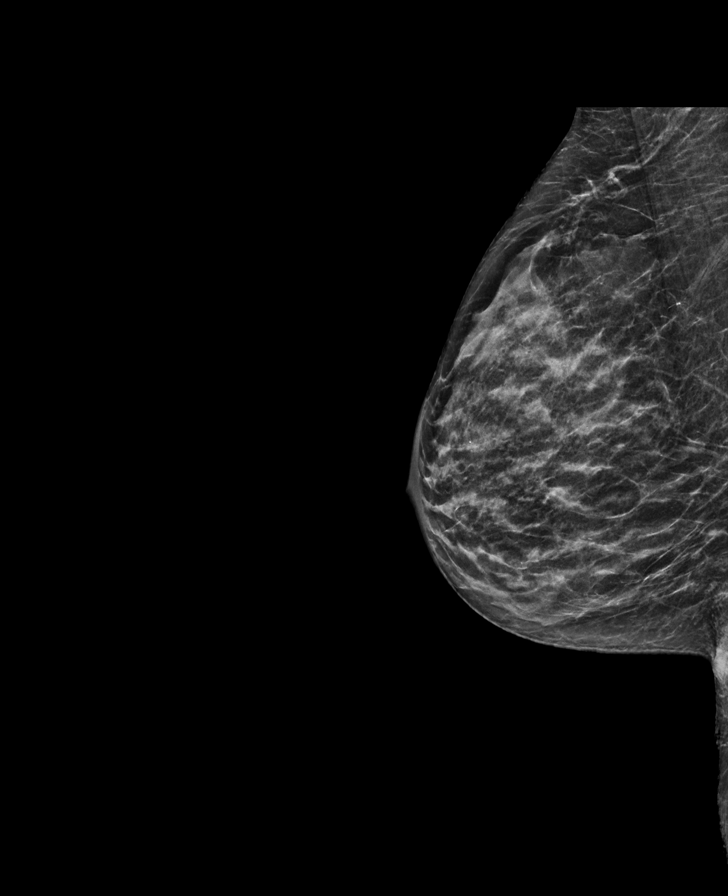

[L CC synth-2D]
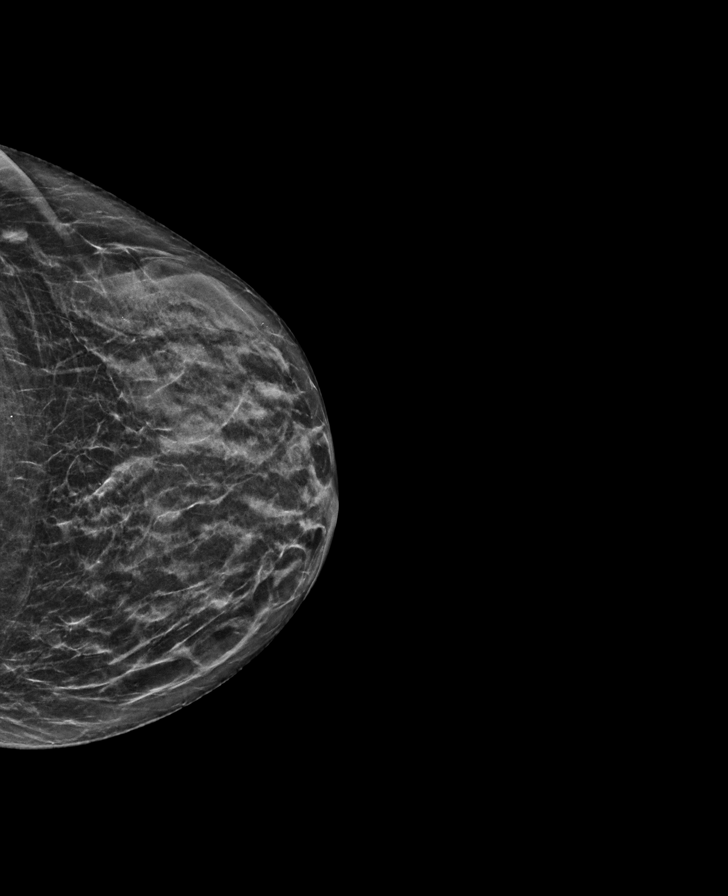

[R CC synth-2D]
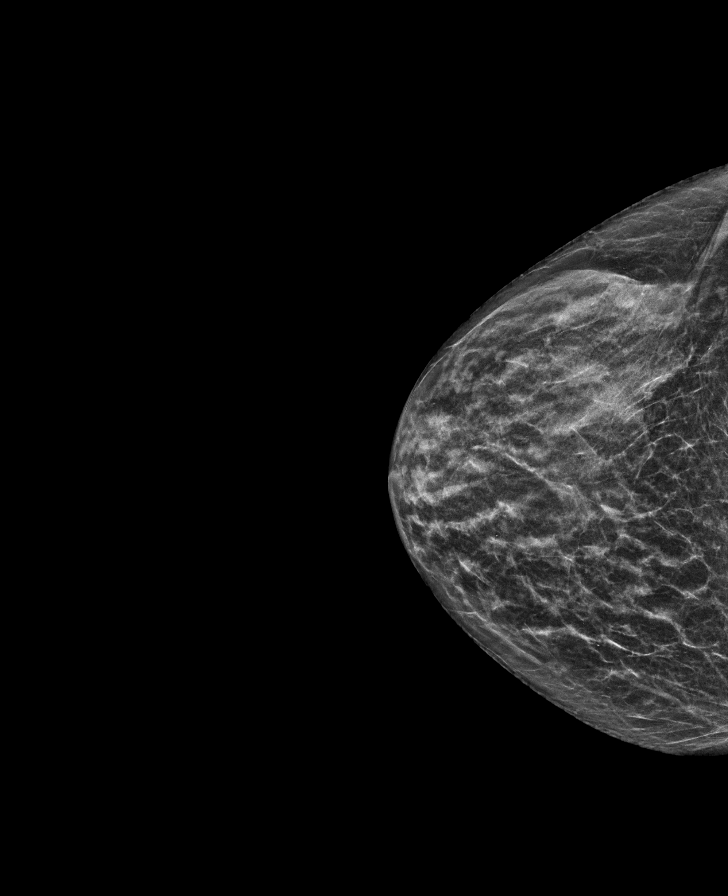

[R MLO tomo · tomo slice 27/53.0]
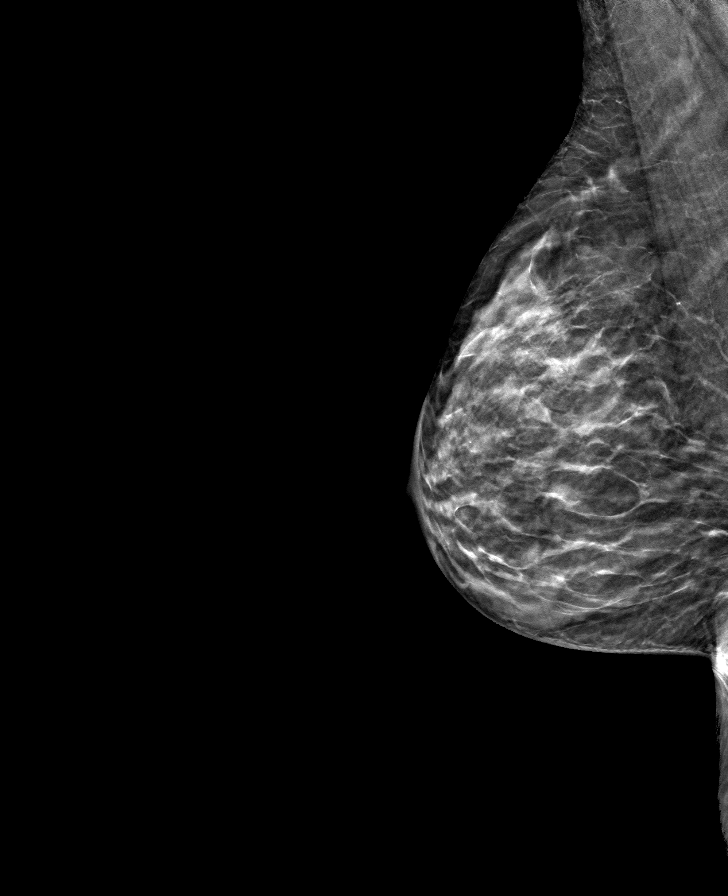

[R CC tomo · tomo slice 25/48.0]
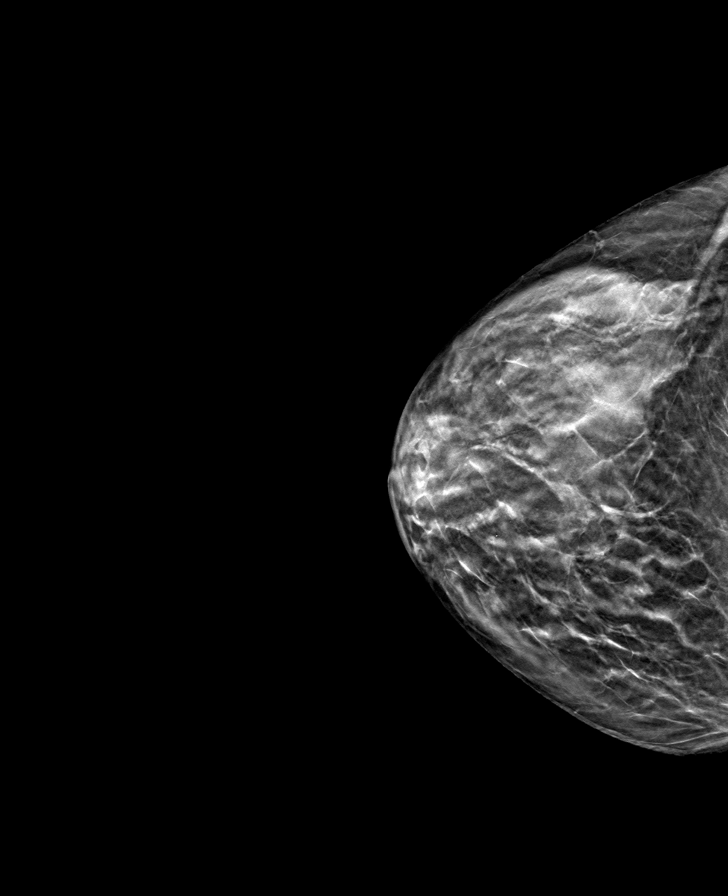

[L CC tomo · tomo slice 27/53.0]
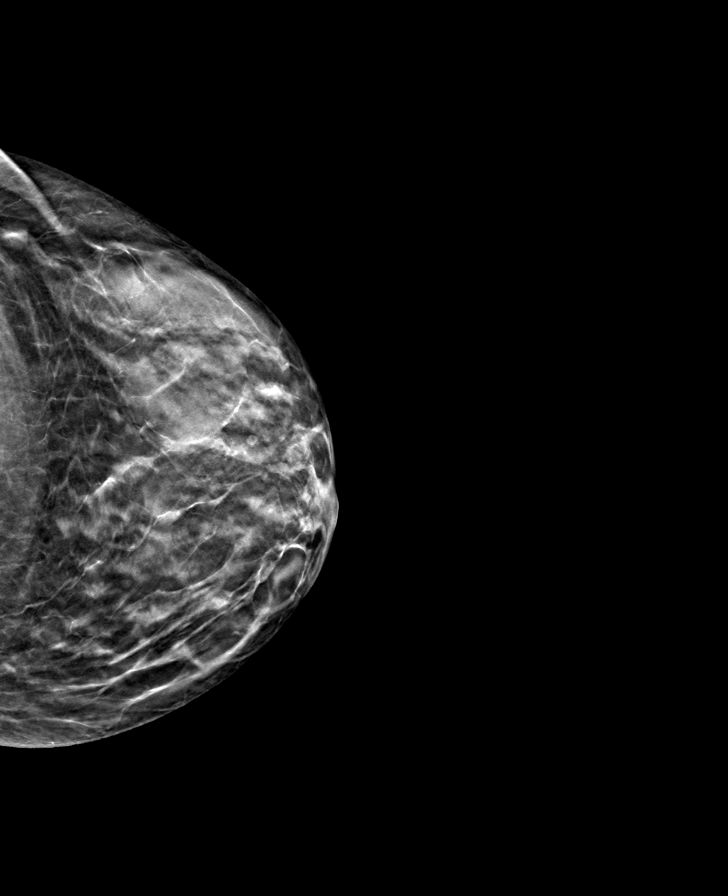

[L MLO tomo · tomo slice 27/54.0]
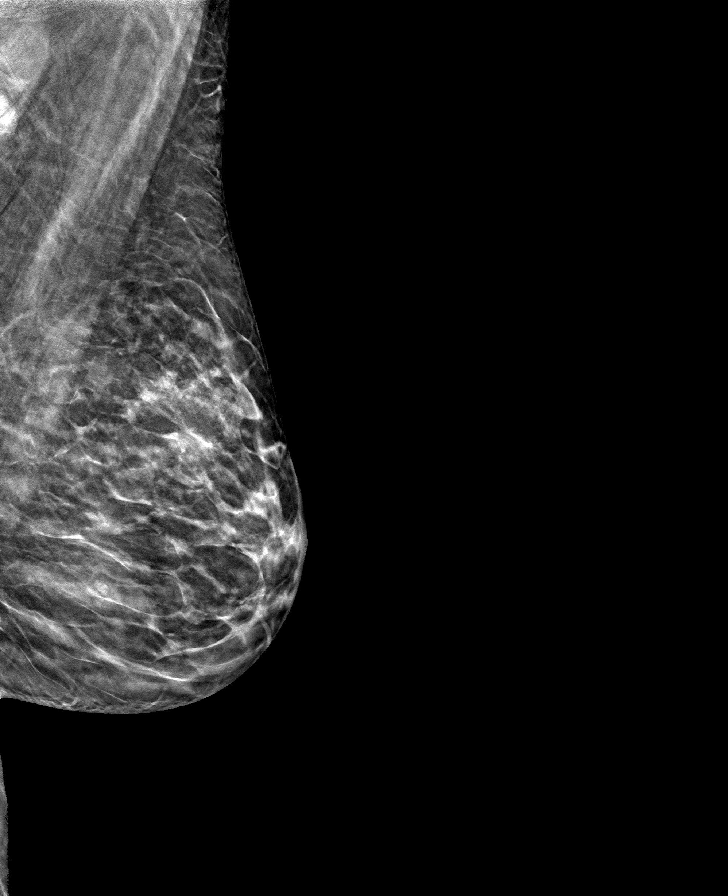

[8 of 24 positions shown; findings below may reference images not displayed]

ACR Breast Density Category c: The breast tissue is heterogeneously
dense, which may obscure small masses.
FINDINGS: There are no findings suspicious for malignancy.
IMPRESSION: No mammographic evidence of malignancy. A result letter of this
screening mammogram will be mailed directly to the patient.

RECOMMENDATION:
Screening mammogram in one year. (Code:Q3-W-BC3)

BI-RADS CATEGORY  1: Negative.

## 2022-09-09 ENCOUNTER — Other Ambulatory Visit: Payer: Self-pay | Admitting: Family Medicine

## 2022-09-09 DIAGNOSIS — E039 Hypothyroidism, unspecified: Secondary | ICD-10-CM

## 2022-09-15 ENCOUNTER — Ambulatory Visit: Payer: BC Managed Care – PPO | Admitting: Internal Medicine

## 2022-09-29 ENCOUNTER — Ambulatory Visit: Payer: BC Managed Care – PPO | Admitting: Sports Medicine

## 2022-09-29 ENCOUNTER — Encounter: Payer: Self-pay | Admitting: Sports Medicine

## 2022-09-29 VITALS — BP 135/68 | HR 104 | Ht 64.0 in

## 2022-09-29 DIAGNOSIS — J029 Acute pharyngitis, unspecified: Secondary | ICD-10-CM | POA: Diagnosis not present

## 2022-09-29 LAB — POCT INFLUENZA A/B
Influenza A, POC: NEGATIVE
Influenza B, POC: NEGATIVE

## 2022-09-29 LAB — POCT RAPID STREP A (OFFICE): Rapid Strep A Screen: NEGATIVE

## 2022-09-29 MED ORDER — AZITHROMYCIN 250 MG PO TABS: ORAL_TABLET | ORAL | 0 refills | Status: DC

## 2022-09-29 NOTE — Progress Notes (Signed)
    Procedures performed today:    None.  Independent interpretation of notes and tests performed by another provider:   None.  Brief History, Exam, Impression, and Recommendations:    Pharyngitis This is a very pleasant 62 year old female, she has a 1 day history of sore throat radiating to the left ear, muscle aches and bodyaches without fevers, runny nose without cough. Her exam is unrevealing though she appears unwell. She did a COVID test at home which was negative. Her partner did have strep just over a week ago and treated with antibiotics and steroids. Today we did rapid strep and flu tests, the strep test was negative, the flu test was negative however considering close contact with a positive confirmed streptococcal pharyngitis we will treat with azithromycin.  I spent 30 minutes of total time managing this patient today, this includes chart review, face to face, and non-face to face time.  ____________________________________________ Ihor Austin. Benjamin Stain, M.D., ABFM., CAQSM., AME. Primary Care and Sports Medicine [facility]  Adjunct Professor of Family Medicine  [employer]  Restaurant manager, fast food

## 2022-09-29 NOTE — Assessment & Plan Note (Addendum)
This is a very pleasant 62 year old female, she has a 1 day history of sore throat radiating to the left ear, muscle aches and bodyaches without fevers, runny nose without cough. Her exam is unrevealing though she appears unwell. She did a COVID test at home which was negative. Her partner did have strep just over a week ago and treated with antibiotics and steroids. Today we did rapid strep and flu tests, the strep test was negative, the flu test was negative however considering close contact with a positive confirmed streptococcal pharyngitis we will treat with azithromycin.

## 2022-09-29 NOTE — Addendum Note (Signed)
Addended by: Carren Rang A on: 09/29/2022 03:37 PM   Modules accepted: Orders

## 2022-10-16 ENCOUNTER — Other Ambulatory Visit: Payer: Self-pay | Admitting: Family Medicine

## 2022-11-14 ENCOUNTER — Encounter: Payer: Self-pay | Admitting: Family Medicine

## 2022-11-14 DIAGNOSIS — E039 Hypothyroidism, unspecified: Secondary | ICD-10-CM

## 2022-11-17 DIAGNOSIS — E039 Hypothyroidism, unspecified: Secondary | ICD-10-CM | POA: Diagnosis not present

## 2022-11-18 LAB — TSH: TSH: 0.42 mIU/L (ref 0.40–4.50)

## 2022-11-20 NOTE — Progress Notes (Signed)
Hi Kim, TSH is 0.42.  This is again on the lower end so I just want a make sure that were not overmedicating you.  The sweet spot is between 1 and 2.  Like to make a really minor change.  Please just verify your dosing.

## 2022-11-21 ENCOUNTER — Encounter: Payer: Self-pay | Admitting: Family Medicine

## 2022-11-21 NOTE — Progress Notes (Signed)
I would recommend: One tablet and one tablet 6 days per week One tablet one day per week.   That we were only decreasing her dose by 45 mcg for the week.  And then we can recheck level again in 3 months.

## 2023-01-03 ENCOUNTER — Encounter: Payer: Self-pay | Admitting: Family Medicine

## 2023-01-03 MED ORDER — AZITHROMYCIN 250 MG PO TABS: ORAL_TABLET | ORAL | 0 refills | Status: AC

## 2023-01-03 MED ORDER — ALPRAZOLAM 0.5 MG PO TABS: 0.2500 mg | ORAL_TABLET | Freq: Three times a day (TID) | ORAL | 0 refills | Status: DC | PRN

## 2023-01-05 DIAGNOSIS — J019 Acute sinusitis, unspecified: Secondary | ICD-10-CM | POA: Diagnosis not present

## 2023-01-27 DIAGNOSIS — R059 Cough, unspecified: Secondary | ICD-10-CM | POA: Diagnosis not present

## 2023-01-27 DIAGNOSIS — Z20822 Contact with and (suspected) exposure to covid-19: Secondary | ICD-10-CM | POA: Diagnosis not present

## 2023-01-27 DIAGNOSIS — J029 Acute pharyngitis, unspecified: Secondary | ICD-10-CM | POA: Diagnosis not present

## 2023-01-27 DIAGNOSIS — R0981 Nasal congestion: Secondary | ICD-10-CM | POA: Diagnosis not present

## 2023-02-21 DIAGNOSIS — H00015 Hordeolum externum left lower eyelid: Secondary | ICD-10-CM | POA: Diagnosis not present

## 2023-02-26 ENCOUNTER — Encounter: Payer: Self-pay | Admitting: Family Medicine

## 2023-02-26 DIAGNOSIS — Z0000 Encounter for general adult medical examination without abnormal findings: Secondary | ICD-10-CM

## 2023-02-26 DIAGNOSIS — E7800 Pure hypercholesterolemia, unspecified: Secondary | ICD-10-CM

## 2023-02-26 DIAGNOSIS — E039 Hypothyroidism, unspecified: Secondary | ICD-10-CM

## 2023-02-26 DIAGNOSIS — R7301 Impaired fasting glucose: Secondary | ICD-10-CM

## 2023-03-19 DIAGNOSIS — R7301 Impaired fasting glucose: Secondary | ICD-10-CM | POA: Diagnosis not present

## 2023-03-19 DIAGNOSIS — E7800 Pure hypercholesterolemia, unspecified: Secondary | ICD-10-CM | POA: Diagnosis not present

## 2023-03-19 DIAGNOSIS — Z0000 Encounter for general adult medical examination without abnormal findings: Secondary | ICD-10-CM | POA: Diagnosis not present

## 2023-03-19 DIAGNOSIS — E039 Hypothyroidism, unspecified: Secondary | ICD-10-CM | POA: Diagnosis not present

## 2023-03-20 LAB — COMPLETE METABOLIC PANEL WITH GFR
AG Ratio: 1.6 (calc) (ref 1.0–2.5)
ALT: 24 U/L (ref 6–29)
AST: 21 U/L (ref 10–35)
Albumin: 4.2 g/dL (ref 3.6–5.1)
Alkaline phosphatase (APISO): 63 U/L (ref 37–153)
BUN: 17 mg/dL (ref 7–25)
CO2: 29 mmol/L (ref 20–32)
Calcium: 9.2 mg/dL (ref 8.6–10.4)
Chloride: 105 mmol/L (ref 98–110)
Creat: 0.76 mg/dL (ref 0.50–1.05)
Globulin: 2.6 g/dL (ref 1.9–3.7)
Glucose, Bld: 107 mg/dL — ABNORMAL HIGH (ref 65–99)
Potassium: 4.4 mmol/L (ref 3.5–5.3)
Sodium: 140 mmol/L (ref 135–146)
Total Bilirubin: 0.6 mg/dL (ref 0.2–1.2)
Total Protein: 6.8 g/dL (ref 6.1–8.1)
eGFR: 90 mL/min/{1.73_m2} (ref >=60)

## 2023-03-20 LAB — CBC WITH DIFFERENTIAL/PLATELET
Absolute Lymphocytes: 2342 {cells}/uL (ref 850–3900)
Absolute Monocytes: 382 {cells}/uL (ref 200–950)
Basophils Absolute: 49 {cells}/uL (ref 0–200)
Basophils Relative: 1 %
Eosinophils Absolute: 191 {cells}/uL (ref 15–500)
Eosinophils Relative: 3.9 %
HCT: 40.1 % (ref 35.0–45.0)
Hemoglobin: 13.1 g/dL (ref 11.7–15.5)
MCH: 28.7 pg (ref 27.0–33.0)
MCHC: 32.7 g/dL (ref 32.0–36.0)
MCV: 87.9 fL (ref 80.0–100.0)
MPV: 9.4 fL (ref 7.5–12.5)
Monocytes Relative: 7.8 %
Neutro Abs: 1936 {cells}/uL (ref 1500–7800)
Neutrophils Relative %: 39.5 %
Platelets: 280 10*3/uL (ref 140–400)
RBC: 4.56 10*6/uL (ref 3.80–5.10)
RDW: 14.4 % (ref 11.0–15.0)
Total Lymphocyte: 47.8 %
WBC: 4.9 10*3/uL (ref 3.8–10.8)

## 2023-03-20 LAB — LIPID PANEL W/REFLEX DIRECT LDL
Cholesterol: 202 mg/dL — ABNORMAL HIGH (ref <200)
HDL: 105 mg/dL (ref >=50)
LDL Cholesterol (Calc): 84 mg/dL
Non-HDL Cholesterol (Calc): 97 mg/dL (ref <130)
Total CHOL/HDL Ratio: 1.9 (calc) (ref <5.0)
Triglycerides: 44 mg/dL (ref <150)

## 2023-03-20 LAB — HEMOGLOBIN A1C
Hgb A1c MFr Bld: 6 %{Hb} — ABNORMAL HIGH (ref <5.7)
Mean Plasma Glucose: 126 mg/dL
eAG (mmol/L): 7 mmol/L

## 2023-03-20 LAB — TSH: TSH: 3.32 m[IU]/L (ref 0.40–4.50)

## 2023-03-22 ENCOUNTER — Other Ambulatory Visit: Payer: Self-pay | Admitting: Family Medicine

## 2023-03-22 NOTE — Progress Notes (Signed)
Hi Tina Pace, metabolic panel overall looks good.  Total cholesterol and LDL look better this year, great work!  A1c is still in the prediabetes range at 6.0 so up a little bit compared to last year.  Thyroid is also shifted just slightly upwards.  Do you have an upcoming appointment?  I know I have not seen you in a while, and might be good to go over some of this.  Blood count is normal no anemia.

## 2023-03-25 ENCOUNTER — Other Ambulatory Visit: Payer: Self-pay | Admitting: Family Medicine

## 2023-03-25 DIAGNOSIS — E039 Hypothyroidism, unspecified: Secondary | ICD-10-CM

## 2023-03-28 ENCOUNTER — Encounter: Payer: Self-pay | Admitting: Family Medicine

## 2023-05-25 ENCOUNTER — Other Ambulatory Visit: Payer: Self-pay | Admitting: Family Medicine

## 2023-05-25 DIAGNOSIS — Z1231 Encounter for screening mammogram for malignant neoplasm of breast: Secondary | ICD-10-CM

## 2023-06-17 ENCOUNTER — Encounter: Payer: Self-pay | Admitting: Family Medicine

## 2023-06-17 DIAGNOSIS — E039 Hypothyroidism, unspecified: Secondary | ICD-10-CM

## 2023-06-18 NOTE — Telephone Encounter (Signed)
Please get her scheduled for CPE this month. I haven't seen her since 2023.   Lab printed. Please fax to local below.

## 2023-06-19 ENCOUNTER — Ambulatory Visit: Payer: BC Managed Care – PPO

## 2023-06-19 NOTE — Telephone Encounter (Signed)
Yes too far out. Ok for March. I haven't seen her since 2023

## 2023-06-19 NOTE — Telephone Encounter (Signed)
Lab order faxed to [facility] at [phone]

## 2023-06-20 NOTE — Telephone Encounter (Signed)
 Patient rescheduled for July 24, 2023.

## 2023-06-25 ENCOUNTER — Other Ambulatory Visit: Payer: Self-pay | Admitting: Family Medicine

## 2023-06-25 DIAGNOSIS — E039 Hypothyroidism, unspecified: Secondary | ICD-10-CM | POA: Diagnosis not present

## 2023-06-26 ENCOUNTER — Encounter: Payer: Self-pay | Admitting: Family Medicine

## 2023-06-26 LAB — TSH: TSH: 0.5 m[IU]/L (ref 0.40–4.50)

## 2023-06-27 DIAGNOSIS — F4323 Adjustment disorder with mixed anxiety and depressed mood: Secondary | ICD-10-CM | POA: Diagnosis not present

## 2023-06-28 ENCOUNTER — Other Ambulatory Visit: Payer: Self-pay | Admitting: Family Medicine

## 2023-06-28 DIAGNOSIS — E039 Hypothyroidism, unspecified: Secondary | ICD-10-CM

## 2023-06-28 NOTE — Telephone Encounter (Signed)
Copied from CRM [id number]. Topic: Clinical - Medication Refill >> Jun 28, 2023 10:00 AM Clayton Bibles wrote: Most Recent Primary Care Visit:  Provider: Monica Becton  Department: [facility]  Visit Type: SAME DAY  Date: 09/29/2022  Medication: ARMOUR THYROID 90 MG tablet  Has the patient contacted their pharmacy? Yes (Agent: If no, request that the patient contact the pharmacy for the refill. If patient does not wish to contact the pharmacy document the reason why and proceed with request.) (Agent: If yes, when and what did the pharmacy advise?) [facility]  Is this the correct pharmacy for this prescription? Yes If no, delete pharmacy and type the correct one.  This is the patient's preferred pharmacy:  [id number] [place], [place] - [address] [place] [address] Phone: [phone] Fax: [phone]   Has the prescription been filled recently? No  Is the patient out of the medication? No - 2 days left   Has the patient been seen for an appointment in the last year OR does the patient have an upcoming appointment? Yes She has an appointment on March 11 at 9:50 AM  Can we respond through MyChart? Yes  Agent: Please be advised that Rx refills may take up to 3 business days. We ask that you follow-up with your pharmacy.

## 2023-07-02 ENCOUNTER — Encounter: Payer: Self-pay | Admitting: Family Medicine

## 2023-07-02 DIAGNOSIS — E039 Hypothyroidism, unspecified: Secondary | ICD-10-CM

## 2023-07-02 MED ORDER — THYROID 90 MG PO TABS: 90.0000 mg | ORAL_TABLET | Freq: Every day | ORAL | 3 refills | Status: AC

## 2023-07-04 ENCOUNTER — Ambulatory Visit: Payer: BC Managed Care – PPO

## 2023-07-04 DIAGNOSIS — F4323 Adjustment disorder with mixed anxiety and depressed mood: Secondary | ICD-10-CM | POA: Diagnosis not present

## 2023-07-10 DIAGNOSIS — F4323 Adjustment disorder with mixed anxiety and depressed mood: Secondary | ICD-10-CM | POA: Diagnosis not present

## 2023-07-17 DIAGNOSIS — F4323 Adjustment disorder with mixed anxiety and depressed mood: Secondary | ICD-10-CM | POA: Diagnosis not present

## 2023-07-18 ENCOUNTER — Ambulatory Visit: Payer: BC Managed Care – PPO

## 2023-07-24 ENCOUNTER — Encounter: Payer: BC Managed Care – PPO | Admitting: Family Medicine

## 2023-07-24 DIAGNOSIS — F4323 Adjustment disorder with mixed anxiety and depressed mood: Secondary | ICD-10-CM | POA: Diagnosis not present

## 2023-07-27 ENCOUNTER — Ambulatory Visit
Admission: RE | Admit: 2023-07-27 | Discharge: 2023-07-27 | Disposition: A | Discharge status: Home / Self Care | Payer: BC Managed Care – PPO | Source: Ambulatory Visit | Attending: Family Medicine | Admitting: Family Medicine

## 2023-07-27 DIAGNOSIS — Z1231 Encounter for screening mammogram for malignant neoplasm of breast: Secondary | ICD-10-CM | POA: Diagnosis not present

## 2023-07-31 ENCOUNTER — Encounter: Payer: Self-pay | Admitting: Family Medicine

## 2023-07-31 NOTE — Progress Notes (Signed)
 Please call patient. Normal mammogram.  Repeat in 1 year.

## 2023-08-01 ENCOUNTER — Other Ambulatory Visit: Payer: Self-pay | Admitting: Family Medicine

## 2023-08-01 DIAGNOSIS — F4323 Adjustment disorder with mixed anxiety and depressed mood: Secondary | ICD-10-CM | POA: Diagnosis not present

## 2023-08-07 DIAGNOSIS — F4323 Adjustment disorder with mixed anxiety and depressed mood: Secondary | ICD-10-CM | POA: Diagnosis not present

## 2023-08-09 ENCOUNTER — Encounter: Payer: Self-pay | Admitting: Family Medicine

## 2023-08-09 ENCOUNTER — Ambulatory Visit (INDEPENDENT_AMBULATORY_CARE_PROVIDER_SITE_OTHER): Payer: BC Managed Care – PPO | Admitting: Family Medicine

## 2023-08-09 VITALS — BP 129/72 | HR 81 | Ht 64.0 in | Wt 145.0 lb

## 2023-08-09 DIAGNOSIS — R7301 Impaired fasting glucose: Secondary | ICD-10-CM | POA: Diagnosis not present

## 2023-08-09 DIAGNOSIS — M542 Cervicalgia: Secondary | ICD-10-CM

## 2023-08-09 DIAGNOSIS — Z0000 Encounter for general adult medical examination without abnormal findings: Secondary | ICD-10-CM | POA: Diagnosis not present

## 2023-08-09 DIAGNOSIS — M25551 Pain in right hip: Secondary | ICD-10-CM

## 2023-08-09 DIAGNOSIS — E039 Hypothyroidism, unspecified: Secondary | ICD-10-CM

## 2023-08-09 DIAGNOSIS — G43009 Migraine without aura, not intractable, without status migrainosus: Secondary | ICD-10-CM

## 2023-08-09 NOTE — Progress Notes (Signed)
 Complete physical exam  Patient: Tina Pace   DOB: [birth year]   63 y.o. Female  MRN: [id number]  Subjective:    Chief Complaint  Patient presents with   Annual Exam    Tina Pace is a 63 y.o. female who presents today for a complete physical exam. She reports consuming a general diet.  Enjoys hiking.  She generally feels well.  She does have additional problems to discuss today.   Pain over right lateral hip occ radiates down lateral part of her knee. Notices when more bothersome she tends to walk more om the outside of that right foot. Has had dry needling before and feels it could help again.   Also getting more frequent HA notices more when doesn't wear her mouthguard.  Also has some neck pain. Would like to see if PT can help that too.    Most recent fall risk assessment:    08/09/2023    1:49 PM  Fall Risk   Falls in the past year? 0  Number falls in past yr: 0  Injury with Fall? 0  Risk for fall due to : No Fall Risks  Follow up Falls evaluation completed     Most recent depression screenings:    08/09/2023    1:49 PM 03/21/2022   10:46 AM  PHQ 2/9 Scores  PHQ - 2 Score 3 0  PHQ- 9 Score 5          Patient Care Team: Agapito Games, MD as PCP - General (Family Medicine)   Outpatient Medications Prior to Visit  Medication Sig   B Complex Vitamins (B COMPLEX PO) Take by mouth.   CALCIUM PO Take by mouth.   cholecalciferol (VITAMIN D) 1000 units tablet Take 1,000 Units by mouth daily.   fexofenadine (ALLEGRA) 180 MG tablet Take 180 mg by mouth daily.   fluticasone (FLONASE) 50 MCG/ACT nasal spray Place 2 sprays into both nostrils daily.   Omega-3 Fatty Acids (FISH OIL) 1000 MG CAPS Take by mouth.   thyroid (ARMOUR THYROID) 15 MG tablet 2 TABS BY MOUTH ON MONDAY, WEDNESDAY AND FRIDAY. AND 1 TAB DAILY THE OTHER 4 DAYS OF THE WEEK.   thyroid (ARMOUR THYROID) 90 MG tablet Take 1 tablet (90 mg total) by mouth daily.   YUVAFEM 10 MCG TABS vaginal  tablet PLACE 1 TABLET VAGINALLY 2 TIMES A WEEK.   zolmitriptan (ZOMIG) 5 MG tablet TAKE 1 TABLET DAILY AS NEEDED MAY REPEAT DOSE IN 1 HOUR IF NO EFFECT   [DISCONTINUED] ALPRAZolam (XANAX) 0.5 MG tablet Take 0.5-1 tablets (0.25-0.5 mg total) by mouth 3 (three) times daily as needed for anxiety. For flight anxiety   No facility-administered medications prior to visit.    ROS        Objective:     BP 129/72   Pulse 81   Ht 5\' 4"  (1.626 m)   Wt 145 lb (65.8 kg)   SpO2 98%   BMI 24.89 kg/m     Physical Exam Constitutional:      Appearance: Normal appearance.  HENT:     Head: Normocephalic and atraumatic.     Right Ear: Tympanic membrane, ear canal and external ear normal.     Left Ear: Tympanic membrane, ear canal and external ear normal.     Nose: Nose normal.     Mouth/Throat:     Pharynx: Oropharynx is clear.  Eyes:     Extraocular Movements: Extraocular movements intact.  Conjunctiva/sclera: Conjunctivae normal.     Pupils: Pupils are equal, round, and reactive to light.  Neck:     Thyroid: No thyromegaly.  Cardiovascular:     Rate and Rhythm: Normal rate and regular rhythm.  Pulmonary:     Effort: Pulmonary effort is normal.     Breath sounds: Normal breath sounds.  Abdominal:     General: Bowel sounds are normal.     Palpations: Abdomen is soft.     Tenderness: There is no abdominal tenderness.  Musculoskeletal:        General: No swelling.     Cervical back: Neck supple.  Skin:    General: Skin is warm and dry.  Neurological:     Mental Status: She is oriented to person, place, and time.  Psychiatric:        Mood and Affect: Mood normal.        Behavior: Behavior normal.      No results found for any visits on 08/09/23.      Assessment & Plan:    Routine Health Maintenance and Physical Exam  Immunization History  Administered Date(s) Administered   Influenza Inj Mdck Quad Pf 01/02/2022, 12/27/2022   Influenza, Seasonal, Injecte,  Preservative Fre 04/12/2016, 03/28/2018   Influenza,inj,Quad PF,6+ Mos 01/09/2019, 03/24/2020   PFIZER(Purple Top)SARS-COV-2 Vaccination 08/01/2019, 08/22/2019, 03/24/2020   Pfizer(Comirnaty)Fall Seasonal Vaccine 12 years and older 04/07/2023   Tdap 08/17/2020    Health Maintenance  Topic Date Due   HIV Screening  Never done   Hepatitis C Screening  Never done   Zoster Vaccines- Shingrix (1 of 2) Never done   MAMMOGRAM  07/26/2025   Colonoscopy  11/10/2026   Cervical Cancer Screening (HPV/Pap Cotest)  07/21/2027   DTaP/Tdap/Td (2 - Td or Tdap) 08/18/2030   INFLUENZA VACCINE  Completed   COVID-19 Vaccine  Completed   HPV VACCINES  Aged Out    Discussed health benefits of physical activity, and encouraged her to engage in regular exercise appropriate for her age and condition.  Problem List Items Addressed This Visit       Cardiovascular and Mediastinum   Migraine without aura and without status migrainosus, not intractable   Has had a few more HA, notices more lately since has been more stressed and also when forgets to use her mouth guard.  Wonders if neck could trigger migraines as well.          Endocrine   IFG (impaired fasting glucose)   Due for A1C.        Relevant Orders   CMP14+EGFR   TSH   Hemoglobin A1c   Acquired hypothyroidism   Due to recheck TSH.        Relevant Orders   CMP14+EGFR   TSH   Hemoglobin A1c   Other Visit Diagnoses       Health maintenance examination    -  Primary   Relevant Orders   CMP14+EGFR   TSH   Hemoglobin A1c     Right hip pain       Relevant Orders   Ambulatory referral to Physical Therapy     Neck pain       Relevant Orders   Ambulatory referral to Physical Therapy      Return in about 1 year (around 08/08/2024) for Wellness Exam.  Keep up a regular exercise program and make sure you are eating a healthy diet Try to eat 4 servings of dairy a day, or if you are lactose  intolerant take a calcium with vitamin D  daily.  Your vaccines are up to date.      Nani Gasser, MD

## 2023-08-09 NOTE — Assessment & Plan Note (Signed)
 Due to recheck TSH.

## 2023-08-09 NOTE — Assessment & Plan Note (Signed)
Due for A1C  

## 2023-08-09 NOTE — Assessment & Plan Note (Addendum)
 Has had a few more HA, notices more lately since has been more stressed and also when forgets to use her mouth guard.  Wonders if neck could trigger migraines as well.

## 2023-08-10 ENCOUNTER — Encounter: Payer: Self-pay | Admitting: Family Medicine

## 2023-08-10 LAB — CMP14+EGFR
ALT: 22 IU/L (ref 0–32)
AST: 26 IU/L (ref 0–40)
Albumin: 4.6 g/dL (ref 3.9–4.9)
Alkaline Phosphatase: 81 IU/L (ref 44–121)
BUN/Creatinine Ratio: 19 (ref 12–28)
BUN: 17 mg/dL (ref 8–27)
Bilirubin Total: 0.5 mg/dL (ref 0.0–1.2)
CO2: 24 mmol/L (ref 20–29)
Calcium: 9.8 mg/dL (ref 8.7–10.3)
Chloride: 102 mmol/L (ref 96–106)
Creatinine, Ser: 0.9 mg/dL (ref 0.57–1.00)
Globulin, Total: 2.3 g/dL (ref 1.5–4.5)
Glucose: 123 mg/dL — ABNORMAL HIGH (ref 70–99)
Potassium: 4.6 mmol/L (ref 3.5–5.2)
Sodium: 140 mmol/L (ref 134–144)
Total Protein: 6.9 g/dL (ref 6.0–8.5)
eGFR: 73 mL/min/{1.73_m2} (ref >59)

## 2023-08-10 LAB — HEMOGLOBIN A1C
Est. average glucose Bld gHb Est-mCnc: 131 mg/dL
Hgb A1c MFr Bld: 6.2 % — ABNORMAL HIGH (ref 4.8–5.6)

## 2023-08-10 LAB — TSH: TSH: 1.92 u[IU]/mL (ref 0.450–4.500)

## 2023-08-10 NOTE — Progress Notes (Signed)
 Hi Tina Pace, kidney and liver function are stable.  A1c was 6.2 so still in the prediabetes range and it is up compared to back in the fall just a little bit remember 6.5 is full-blown diabetes.  Just encourage you to continue to really work on cutting back on carbohydrates and sweets more vegetables and continue to exercise regularly.  If it stays in that low to mid 6 range we may need to consider starting medication.  Will plan to keep an eye on it and recheck again in 4 months.  Thyroid looks great.

## 2023-08-24 ENCOUNTER — Encounter: Payer: BC Managed Care – PPO | Admitting: Family Medicine

## 2023-08-29 DIAGNOSIS — F4323 Adjustment disorder with mixed anxiety and depressed mood: Secondary | ICD-10-CM | POA: Diagnosis not present

## 2023-08-30 DIAGNOSIS — F4320 Adjustment disorder, unspecified: Secondary | ICD-10-CM | POA: Diagnosis not present

## 2023-09-07 ENCOUNTER — Encounter: Payer: Self-pay | Admitting: Family Medicine

## 2023-09-07 MED ORDER — DOXYCYCLINE HYCLATE 100 MG PO TABS: 200.0000 mg | ORAL_TABLET | Freq: Once | ORAL | 0 refills | Status: AC

## 2023-09-12 DIAGNOSIS — F4323 Adjustment disorder with mixed anxiety and depressed mood: Secondary | ICD-10-CM | POA: Diagnosis not present

## 2023-09-22 ENCOUNTER — Other Ambulatory Visit: Payer: Self-pay | Admitting: Obstetrics & Gynecology

## 2023-09-24 NOTE — Telephone Encounter (Signed)
 Medication refill request: Yuvafem   Last AEX:  07/21/22 Next AEX: 03/25/24 with BS  Last MMG (if hormonal medication request): 07/31/23 Bi-rads 1 ned  Refill authorized: please advise

## 2023-10-02 ENCOUNTER — Other Ambulatory Visit: Payer: Self-pay | Admitting: Family Medicine

## 2023-10-02 ENCOUNTER — Encounter: Payer: Self-pay | Admitting: Family Medicine

## 2023-10-02 DIAGNOSIS — E039 Hypothyroidism, unspecified: Secondary | ICD-10-CM

## 2023-10-15 ENCOUNTER — Ambulatory Visit: Payer: BC Managed Care – PPO | Admitting: Obstetrics and Gynecology

## 2023-10-16 ENCOUNTER — Other Ambulatory Visit (HOSPITAL_COMMUNITY): Payer: Self-pay | Admitting: Medical

## 2023-10-16 ENCOUNTER — Ambulatory Visit (HOSPITAL_COMMUNITY)
Admission: RE | Admit: 2023-10-16 | Discharge: 2023-10-16 | Disposition: A | Discharge status: Home / Self Care | Source: Ambulatory Visit | Attending: Vascular Surgery | Admitting: Vascular Surgery

## 2023-10-16 DIAGNOSIS — M7989 Other specified soft tissue disorders: Secondary | ICD-10-CM | POA: Diagnosis not present

## 2023-10-16 DIAGNOSIS — M79662 Pain in left lower leg: Secondary | ICD-10-CM

## 2023-10-16 DIAGNOSIS — M25562 Pain in left knee: Secondary | ICD-10-CM | POA: Diagnosis not present

## 2023-10-30 DIAGNOSIS — M25562 Pain in left knee: Secondary | ICD-10-CM | POA: Diagnosis not present

## 2023-11-07 DIAGNOSIS — F4323 Adjustment disorder with mixed anxiety and depressed mood: Secondary | ICD-10-CM | POA: Diagnosis not present

## 2023-11-21 DIAGNOSIS — F4323 Adjustment disorder with mixed anxiety and depressed mood: Secondary | ICD-10-CM | POA: Diagnosis not present

## 2023-11-28 DIAGNOSIS — F4323 Adjustment disorder with mixed anxiety and depressed mood: Secondary | ICD-10-CM | POA: Diagnosis not present

## 2023-12-12 DIAGNOSIS — F4323 Adjustment disorder with mixed anxiety and depressed mood: Secondary | ICD-10-CM | POA: Diagnosis not present

## 2023-12-20 DIAGNOSIS — F4323 Adjustment disorder with mixed anxiety and depressed mood: Secondary | ICD-10-CM | POA: Diagnosis not present

## 2023-12-27 DIAGNOSIS — F4323 Adjustment disorder with mixed anxiety and depressed mood: Secondary | ICD-10-CM | POA: Diagnosis not present

## 2024-01-04 DIAGNOSIS — H2513 Age-related nuclear cataract, bilateral: Secondary | ICD-10-CM | POA: Diagnosis not present

## 2024-01-04 DIAGNOSIS — H524 Presbyopia: Secondary | ICD-10-CM | POA: Diagnosis not present

## 2024-01-08 DIAGNOSIS — F4323 Adjustment disorder with mixed anxiety and depressed mood: Secondary | ICD-10-CM | POA: Diagnosis not present

## 2024-01-15 ENCOUNTER — Encounter: Payer: Self-pay | Admitting: Sports Medicine

## 2024-01-16 DIAGNOSIS — F4323 Adjustment disorder with mixed anxiety and depressed mood: Secondary | ICD-10-CM | POA: Diagnosis not present

## 2024-01-22 ENCOUNTER — Other Ambulatory Visit: Payer: Self-pay | Admitting: Family Medicine

## 2024-01-23 ENCOUNTER — Other Ambulatory Visit: Payer: Self-pay | Admitting: Obstetrics and Gynecology

## 2024-01-23 ENCOUNTER — Encounter: Payer: Self-pay | Admitting: Obstetrics and Gynecology

## 2024-01-23 DIAGNOSIS — F4323 Adjustment disorder with mixed anxiety and depressed mood: Secondary | ICD-10-CM | POA: Diagnosis not present

## 2024-01-23 MED ORDER — IMVEXXY MAINTENANCE PACK 10 MCG VA INST: 10.0000 ug | VAGINAL_INSERT | VAGINAL | 0 refills | Status: DC

## 2024-03-11 DIAGNOSIS — F4323 Adjustment disorder with mixed anxiety and depressed mood: Secondary | ICD-10-CM | POA: Diagnosis not present

## 2024-03-20 DIAGNOSIS — F4323 Adjustment disorder with mixed anxiety and depressed mood: Secondary | ICD-10-CM | POA: Diagnosis not present

## 2024-03-25 ENCOUNTER — Telehealth: Payer: Self-pay | Admitting: Obstetrics and Gynecology

## 2024-03-25 ENCOUNTER — Ambulatory Visit: Admitting: Obstetrics and Gynecology

## 2024-03-25 ENCOUNTER — Encounter: Payer: Self-pay | Admitting: Obstetrics and Gynecology

## 2024-03-25 ENCOUNTER — Other Ambulatory Visit (HOSPITAL_COMMUNITY)
Admission: RE | Admit: 2024-03-25 | Discharge: 2024-03-25 | Disposition: A | Discharge status: Home / Self Care | Source: Ambulatory Visit | Attending: Obstetrics and Gynecology | Admitting: Obstetrics and Gynecology

## 2024-03-25 ENCOUNTER — Other Ambulatory Visit: Payer: Self-pay

## 2024-03-25 VITALS — BP 104/62 | HR 72 | Ht 66.0 in | Wt 142.0 lb

## 2024-03-25 DIAGNOSIS — Z1331 Encounter for screening for depression: Secondary | ICD-10-CM | POA: Diagnosis not present

## 2024-03-25 DIAGNOSIS — R3915 Urgency of urination: Secondary | ICD-10-CM

## 2024-03-25 DIAGNOSIS — Z124 Encounter for screening for malignant neoplasm of cervix: Secondary | ICD-10-CM | POA: Diagnosis not present

## 2024-03-25 DIAGNOSIS — Z01419 Encounter for gynecological examination (general) (routine) without abnormal findings: Secondary | ICD-10-CM

## 2024-03-25 DIAGNOSIS — Z780 Asymptomatic menopausal state: Secondary | ICD-10-CM

## 2024-03-25 MED ORDER — IMVEXXY MAINTENANCE PACK 10 MCG VA INST: 10.0000 ug | VAGINAL_INSERT | VAGINAL | 3 refills | Status: AC

## 2024-03-25 NOTE — Progress Notes (Signed)
 63 y.o. G0P0000 Single Caucasian female here for annual exam.    Using Imvexxy .  It is working well.   Some urinary urgency.  Voiding well.  Up once a night to void.   No dysuria or blood in the urine.     Denies vaginal bleeding.    Would like to do yearly cervical cancer screening.  Partner has hx HPV.   Currently seeing dentist 3x/year.  Likes to hike.  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  Father has terminal esophageal cancer, and patient is helping to care for him. He lives in [place].    PCP: Alvan Dorothyann BIRCH, MD   No LMP recorded. Patient is postmenopausal.           Sexually active: Yes.   Female partner.  The current method of family planning is post menopausal status.    Menopausal hormone therapy:  Imvexxy   Exercising: Yes.    Walking 10k steps a day  Smoker:  Former   OB History  Gravida Para Term Preterm AB Living  0 0 0 0 0 0  SAB IAB Ectopic Multiple Live Births  0 0 0 0 0     HEALTH MAINTENANCE: Last 2 paps:  07/21/22 neg, 08/17/20 neg HR HPV neg History of abnormal Pap or positive HPV:  yes.  ASCUS pap, neg HR HPV 11/22/17.  No treatment.  Mammogram:   07/27/23 Breast Density Cat C, BIRADS Cat 1 neg  Colonoscopy:  11/09/21 - due in 2028.   Bone Density:  n/a  Result  n/a   Immunization History  Administered Date(s) Administered   Influenza Inj Mdck Quad Pf 01/02/2022, 12/27/2022   Influenza, Seasonal, Injecte, Preservative Fre 04/12/2016, 03/28/2018, 03/07/2024   Influenza,inj,Quad PF,6+ Mos 01/09/2019, 03/24/2020   PFIZER(Purple Top)SARS-COV-2 Vaccination 08/01/2019, 08/22/2019, 03/24/2020   Pfizer(Comirnaty)Fall Seasonal Vaccine 12 years and older 04/07/2023   Tdap 08/17/2020      reports that she quit smoking about 26 years ago. Her smoking use included cigarettes. She has never used smokeless tobacco. She reports current alcohol use. She reports that she does not use drugs.  Past Medical History:  Diagnosis Date   Hypothyroid    Migraines      Past Surgical History:  Procedure Laterality Date   dental implant      Current Outpatient Medications  Medication Sig Dispense Refill   B Complex Vitamins (B COMPLEX PO) Take by mouth.     CALCIUM PO Take by mouth.     Estradiol  (IMVEXXY  MAINTENANCE PACK) 10 MCG INST Place 10 mcg vaginally 2 (two) times a week. Place at bedtime. 24 each 0   fexofenadine (ALLEGRA) 180 MG tablet Take 180 mg by mouth daily.     fluticasone  (FLONASE ) 50 MCG/ACT nasal spray Place 2 sprays into both nostrils daily. 16 g 0   Omega-3 Fatty Acids (FISH OIL) 1000 MG CAPS Take by mouth.     thyroid  (ARMOUR THYROID ) 15 MG tablet 2 TABS BY MOUTH ON MONDAY, WEDNESDAY AND FRIDAY. AND 1 TAB DAILY THE OTHER 4 DAYS OF THE WEEK. 120 tablet 1   thyroid  (ARMOUR THYROID ) 90 MG tablet Take 1 tablet (90 mg total) by mouth daily. 90 tablet 3   zolmitriptan  (ZOMIG ) 5 MG tablet TAKE 1 TABLET DAILY AS NEEDED MAY REPEAT DOSE IN 1 HOUR IF NO EFFECT 10 tablet 1   No current facility-administered medications for this visit.    ALLERGIES: Bee pollen  Family History  Problem Relation Age of Onset   Diabetes Mother  Ovarian cancer Mother    Allergic rhinitis Father    Cancer Father        bladder/prostate   Esophageal cancer Father     Review of Systems  All other systems reviewed and are negative.   PHYSICAL EXAM:  BP 104/62 (BP Location: Left Arm, Patient Position: Sitting)   Pulse 72   Ht 5' 6 (1.676 m)   Wt 142 lb (64.4 kg)   SpO2 97%   BMI 22.92 kg/m     General appearance: alert, cooperative and appears stated age Head: normocephalic, without obvious abnormality, atraumatic Neck: no adenopathy, supple, symmetrical, trachea midline and thyroid  normal to inspection and palpation Lungs: clear to auscultation bilaterally Breasts: normal appearance, no masses or tenderness, No nipple retraction or dimpling, No nipple discharge or bleeding, No axillary adenopathy Heart: regular rate and rhythm Abdomen: soft,  non-tender; no masses, no organomegaly Extremities: extremities normal, atraumatic, no cyanosis or edema Skin: skin color, texture, turgor normal. No rashes or lesions Lymph nodes: cervical, supraclavicular, and axillary nodes normal. Neurologic: grossly normal  Pelvic: External genitalia:  no lesions              No abnormal inguinal nodes palpated.              Urethra:  normal appearing urethra with no masses, tenderness or lesions              Bartholins and Skenes: normal                 Vagina: normal appearing vagina with normal color and discharge, no lesions              Cervix: no lesions              Pap taken: yes Bimanual Exam:  Uterus:  normal size, contour, position, consistency, mobility, non-tender              Adnexa: no mass, fullness, tenderness              Rectal exam: yes.  Confirms.              Anus:  normal sphincter tone, no lesions  Chaperone was present for exam:  Gordy PARAS, CMA  ASSESSMENT: Well woman visit with gynecologic exam. Vaginal atrophy.    Urinary urgency.  Cervical cancer screening.  PHQ-2-9: 0. Former smoker.   Menopause.  PLAN: Mammogram screening discussed. Self breast awareness reviewed. Pap and HRV collected:  yes Guidelines for Calcium, Vitamin D , regular exercise program including cardiovascular and weight bearing exercise. Medication refills:  Imvexxy , pv twice weekly.  #24, RF 3.  I reviewed potential effect on breast cancer.  Referral to [facility].   Routine labs with PCP.  [facility]a at [facility].   Order placed.  Patient will call to schedule.  Follow up:  yearly and prn.

## 2024-03-25 NOTE — Telephone Encounter (Signed)
 Please make a referral to [facility] for urinary urgency.

## 2024-03-25 NOTE — Patient Instructions (Signed)

## 2024-03-25 NOTE — Telephone Encounter (Signed)
 Referral placed.

## 2024-03-26 ENCOUNTER — Ambulatory Visit: Payer: Self-pay | Admitting: Obstetrics and Gynecology

## 2024-03-26 LAB — CYTOLOGY - PAP
Adequacy: ABSENT
Comment: NEGATIVE
Diagnosis: NEGATIVE
High risk HPV: NEGATIVE

## 2024-04-02 DIAGNOSIS — F4323 Adjustment disorder with mixed anxiety and depressed mood: Secondary | ICD-10-CM | POA: Diagnosis not present

## 2024-04-09 DIAGNOSIS — F4323 Adjustment disorder with mixed anxiety and depressed mood: Secondary | ICD-10-CM | POA: Diagnosis not present

## 2024-04-23 DIAGNOSIS — F4323 Adjustment disorder with mixed anxiety and depressed mood: Secondary | ICD-10-CM | POA: Diagnosis not present

## 2024-04-29 NOTE — Telephone Encounter (Signed)
 Letter sent to patient by pelvic floor team.  See Epic.  Encounter closed.

## 2024-04-30 DIAGNOSIS — F4323 Adjustment disorder with mixed anxiety and depressed mood: Secondary | ICD-10-CM | POA: Diagnosis not present

## 2024-06-11 ENCOUNTER — Other Ambulatory Visit

## 2024-06-11 NOTE — Progress Notes (Unsigned)
 " [facility] [address] [place] [address] Phone: [phone] Subjective:   Tina Pace, am serving as a scribe for Dr. Arthea Claudene.  I'm seeing this patient by the request  of:  Alvan Dorothyann BIRCH, MD  CC: hip and shoulder pain follow up   YEP:Tina Pace  Tina Pace is a 64 y.o. female coming in with complaint of R hip and R CMC joint pain. Patient states that she has pain in R GT. Loves to walk for exercise. Painful at night to sleep on contralateral hip. Painful to get into FABER position. Does sometimes get tingling in groin. Did put inserts in her shoes and hip pain increased. Wants to know how much activity is too much as she walks 85999 steps a day.   Pain in R CMC joint for the past 2 years. Was told she has arthritis. Wants to know if she should do OT.     Past Medical History:  Diagnosis Date   Hypothyroid    Migraines    Past Surgical History:  Procedure Laterality Date   dental implant     Social History   Socioeconomic History   Marital status: Single    Spouse name: Not on file   Number of children: Not on file   Years of education: Not on file   Highest education level: Not on file  Occupational History   Occupation: teacher  Tobacco Use   Smoking status: Former    Current packs/day: 0.00    Types: Cigarettes    Quit date: 11/22/1997    Years since quitting: 26.5   Smokeless tobacco: Never  Vaping Use   Vaping status: Never Used  Substance and Sexual Activity   Alcohol use: Yes    Comment: OCC   Drug use: Never   Sexual activity: Yes    Partners: Female    Birth control/protection: Post-menopausal    Comment: 1st intercourse- 19, More than 5 partners  Other Topics Concern   Not on file  Social History Narrative   Not on file   Social Drivers of Health   Tobacco Use: Medium Risk (03/25/2024)   Patient History    Smoking Tobacco Use: Former    Smokeless Tobacco Use: Never    Passive  Exposure: Not on file  Financial Resource Strain: Medium Risk (08/09/2023)   Overall Financial Resource Strain (CARDIA)    Difficulty of Paying Living Expenses: Somewhat hard  Food Insecurity: No Food Insecurity (08/09/2023)   Hunger Vital Sign    Worried About Running Out of Food in the Last Year: Never true    Ran Out of Food in the Last Year: Never true  Transportation Needs: No Transportation Needs (08/09/2023)   PRAPARE - Administrator, Civil Service (Medical): No    Lack of Transportation (Non-Medical): No  Physical Activity: Sufficiently Active (08/09/2023)   Exercise Vital Sign    Days of Exercise per Week: 4 days    Minutes of Exercise per Session: 40 min  Recent Concern: Physical Activity - Insufficiently Active (08/09/2023)   Exercise Vital Sign    Days of Exercise per Week: 3 days    Minutes of Exercise per Session: 40 min  Stress: No Stress Concern Present (08/09/2023)   [employer] of Occupational Health - Occupational Stress Questionnaire    Feeling of Stress : Only a little  Social Connections: Unknown (08/09/2023)   Social Connection and Isolation Panel    Frequency  of Communication with Friends and Family: Three times a week    Frequency of Social Gatherings with Friends and Family: Three times a week    Attends Religious Services: 1 to 4 times per year    Active Member of Clubs or Organizations: Yes    Attends Banker Meetings: 1 to 4 times per year    Marital Status: Not on file  Depression (PHQ2-9): Low Risk (03/25/2024)   Depression (PHQ2-9)    PHQ-2 Score: 0  Alcohol Screen: Not on file  Housing: Low Risk (08/09/2023)   Housing Stability Vital Sign    Unable to Pay for Housing in the Last Year: No    Number of Times Moved in the Last Year: 0    Homeless in the Last Year: No  Utilities: Not At Risk (08/09/2023)   AHC Utilities    Threatened with loss of utilities: No  Health Literacy: Adequate Health Literacy (08/09/2023)   B1300  Health Literacy    Frequency of need for help with medical instructions: Rarely   Allergies[1] Family History  Problem Relation Age of Onset   Diabetes Mother    Ovarian cancer Mother    Allergic rhinitis Father    Cancer Father        bladder/prostate   Esophageal cancer Father     Current Outpatient Medications (Endocrine & Metabolic):    thyroid  (ARMOUR THYROID ) 15 MG tablet, 2 TABS BY MOUTH ON MONDAY, WEDNESDAY AND FRIDAY. AND 1 TAB DAILY THE OTHER 4 DAYS OF THE WEEK.   thyroid  (ARMOUR THYROID ) 90 MG tablet, Take 1 tablet (90 mg total) by mouth daily.  Current Outpatient Medications (Respiratory):    fexofenadine (ALLEGRA) 180 MG tablet, Take 180 mg by mouth daily.   fluticasone  (FLONASE ) 50 MCG/ACT nasal spray, Place 2 sprays into both nostrils daily.  Current Outpatient Medications (Analgesics):    zolmitriptan  (ZOMIG ) 5 MG tablet, TAKE 1 TABLET DAILY AS NEEDED MAY REPEAT DOSE IN 1 HOUR IF NO EFFECT  Current Outpatient Medications (Other):    B Complex Vitamins (B COMPLEX PO), Take by mouth.   CALCIUM PO, Take by mouth.   Estradiol  (IMVEXXY  MAINTENANCE PACK) 10 MCG INST, Place 10 mcg vaginally 2 (two) times a week. Place at bedtime.   gabapentin  (NEURONTIN ) 100 MG capsule, Take 2 capsules (200 mg total) by mouth at bedtime.   Omega-3 Fatty Acids (FISH OIL) 1000 MG CAPS, Take by mouth.   Reviewed prior external information including notes and imaging from  primary care provider As well as notes that were available from care everywhere and other healthcare systems.  Past medical history, social, surgical and family history all reviewed in electronic medical record.  No pertanent information unless stated regarding to the chief complaint.   Review of Systems:  No headache, visual changes, nausea, vomiting, diarrhea, constipation, dizziness, abdominal pain, skin rash, fevers, chills, night sweats, weight loss, swollen lymph nodes, body aches, joint swelling, chest pain,  shortness of breath, mood changes. POSITIVE muscle aches  Objective  Blood pressure 118/86, pulse 73, height 5' 6 (1.676 m), weight 142 lb (64.4 kg), SpO2 98%.   General: No apparent distress alert and oriented x3 mood and affect normal, dressed appropriately.  HEENT: Pupils equal, extraocular movements intact  Respiratory: Patient's speak in full sentences and does not appear short of breath  Cardiovascular: No lower extremity edema, non tender, no erythema  Knee exam shows unremarkable, patient's lateral hip disorder severe tenderness to palpation over the greater trochanteric  area on the right side.  Positive FABER test noted.  Negative straight leg test noted.  Right wrist does have a positive Tinel sign noted.  Patient does have some thenar eminence wasting noted.  Limited muscular skeletal ultrasound was performed and interpreted by CLAUDENE HUSSAR, M  Inflammation and hypoechoic change of the median nerve with distortion noted of the median nerve shape secondary to the tightness of the retinaculum. Impression: Severe carpal tunnel    Procedure: Real-time Ultrasound Guided Injection of right greater trochanteric bursitis secondary to patient's body habitus Device: GE Logiq Q7 Ultrasound guided injection is preferred based studies that show increased duration, increased effect, greater accuracy, decreased procedural pain, increased response rate, and decreased cost with ultrasound guided versus blind injection.  Verbal informed consent obtained.  Time-out conducted.  Noted no overlying erythema, induration, or other signs of local infection.  Skin prepped in a sterile fashion.  Local anesthesia: Topical Ethyl chloride.  With sterile technique and under real time ultrasound guidance:  Greater trochanteric area was visualized and patient's bursa was noted. A 22-gauge 3 inch needle was inserted and 4 cc of 0.5% Marcaine and 1 cc of Kenalog 40 mg/dL was injected. Pictures taken Completed  without difficulty  Pain immediately resolved suggesting accurate placement of the medication.  Advised to call if fevers/chills, erythema, induration, drainage, or persistent bleeding.  Images permanently stored  Impression: Technically successful ultrasound guided injection. Impression and Recommendations:     The above documentation has been reviewed and is accurate and complete Ayaka Andes M Jadie Allington, DO       [1]  Allergies Allergen Reactions   Bee Pollen Other (See Comments)    From the trees and plants makes her have runny nose and scracthy throat.   "

## 2024-06-12 ENCOUNTER — Encounter: Payer: Self-pay | Admitting: Family Medicine

## 2024-06-12 ENCOUNTER — Other Ambulatory Visit: Payer: Self-pay

## 2024-06-12 ENCOUNTER — Ambulatory Visit

## 2024-06-12 ENCOUNTER — Ambulatory Visit: Admitting: Family Medicine

## 2024-06-12 VITALS — BP 118/86 | HR 73 | Ht 66.0 in | Wt 142.0 lb

## 2024-06-12 DIAGNOSIS — G5601 Carpal tunnel syndrome, right upper limb: Secondary | ICD-10-CM | POA: Diagnosis not present

## 2024-06-12 DIAGNOSIS — M7061 Trochanteric bursitis, right hip: Secondary | ICD-10-CM

## 2024-06-12 DIAGNOSIS — M79644 Pain in right finger(s): Secondary | ICD-10-CM

## 2024-06-12 MED ORDER — GABAPENTIN 100 MG PO CAPS: 200.0000 mg | ORAL_CAPSULE | Freq: Every day | ORAL | 0 refills | Status: AC

## 2024-06-12 NOTE — Assessment & Plan Note (Signed)
 Carpal tunnel syndrome noted.  Patient does have some mild thenar eminence wasting noted.  Will hold on any other type of aggressive therapy at the moment but will try bracing and home exercises as well as gabapentin  to 100 mg.  Worsening symptoms consider injection at follow-up.  Worsening pain nerve conduction studies for severity may be necessary

## 2024-06-12 NOTE — Patient Instructions (Signed)
 GT injection Carpal tunnel: brace at day and night for 2 weeks then at night for 2 weeks Gabapentin  100mg  at night See me in 2 months

## 2024-06-12 NOTE — Assessment & Plan Note (Signed)
 New onset, patient has been doing a lot more walking.  We discussed different treatment options and patient elected for the injection.  Will get x-rays on the way out.  Discussed icing regimen and home exercises, discussed hip abductor strengthening.  Follow-up again in 6 to 12 weeks.

## 2024-08-08 ENCOUNTER — Ambulatory Visit: Admitting: Family Medicine

## 2024-08-08 ENCOUNTER — Encounter: Admitting: Family Medicine

## 2024-08-13 ENCOUNTER — Other Ambulatory Visit
